# Patient Record
Sex: Male | Born: 1935 | Race: White | Hispanic: No | Marital: Married | Smoking: Never smoker
Health system: Southern US, Community
[De-identification: ages and names within clinical notes are randomized; demographics above are authoritative.]

## PROBLEM LIST (undated history)

## (undated) DIAGNOSIS — R413 Other amnesia: Secondary | ICD-10-CM

## (undated) DIAGNOSIS — E7800 Pure hypercholesterolemia, unspecified: Secondary | ICD-10-CM

## (undated) DIAGNOSIS — N200 Calculus of kidney: Secondary | ICD-10-CM

## (undated) DIAGNOSIS — G4730 Sleep apnea, unspecified: Secondary | ICD-10-CM

## (undated) HISTORY — DX: Calculus of kidney: N20.0

## (undated) HISTORY — DX: Sleep apnea, unspecified: G47.30

## (undated) HISTORY — DX: Other amnesia: R41.3

## (undated) HISTORY — DX: Pure hypercholesterolemia, unspecified: E78.00

---
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
## 1959-06-02 HISTORY — PX: WISDOM TOOTH EXTRACTION: SHX21

## 2002-04-20 ENCOUNTER — Encounter (INDEPENDENT_AMBULATORY_CARE_PROVIDER_SITE_OTHER): Payer: Self-pay | Admitting: *Deleted

## 2002-04-20 ENCOUNTER — Ambulatory Visit (HOSPITAL_BASED_OUTPATIENT_CLINIC_OR_DEPARTMENT_OTHER): Admission: RE | Admit: 2002-04-20 | Discharge: 2002-04-20 | Payer: Self-pay | Admitting: Urology

## 2003-03-01 ENCOUNTER — Ambulatory Visit (HOSPITAL_COMMUNITY): Admission: RE | Admit: 2003-03-01 | Discharge: 2003-03-01 | Payer: Self-pay | Admitting: Gastroenterology

## 2003-03-01 ENCOUNTER — Encounter (INDEPENDENT_AMBULATORY_CARE_PROVIDER_SITE_OTHER): Payer: Self-pay | Admitting: Specialist

## 2007-08-03 ENCOUNTER — Encounter: Payer: Self-pay | Admitting: Internal Medicine

## 2007-08-03 ENCOUNTER — Ambulatory Visit (HOSPITAL_BASED_OUTPATIENT_CLINIC_OR_DEPARTMENT_OTHER): Admission: RE | Admit: 2007-08-03 | Discharge: 2007-08-03 | Payer: Self-pay | Admitting: Otolaryngology

## 2007-08-14 ENCOUNTER — Ambulatory Visit: Payer: Self-pay | Admitting: Internal Medicine

## 2007-11-21 ENCOUNTER — Ambulatory Visit: Payer: Self-pay | Admitting: Internal Medicine

## 2007-11-21 DIAGNOSIS — G4733 Obstructive sleep apnea (adult) (pediatric): Secondary | ICD-10-CM

## 2007-11-21 DIAGNOSIS — E7800 Pure hypercholesterolemia, unspecified: Secondary | ICD-10-CM

## 2007-11-21 DIAGNOSIS — J301 Allergic rhinitis due to pollen: Secondary | ICD-10-CM | POA: Insufficient documentation

## 2007-12-23 ENCOUNTER — Encounter: Payer: Self-pay | Admitting: Internal Medicine

## 2007-12-26 ENCOUNTER — Ambulatory Visit: Payer: Self-pay | Admitting: Internal Medicine

## 2008-02-29 ENCOUNTER — Encounter: Payer: Self-pay | Admitting: Internal Medicine

## 2008-04-24 ENCOUNTER — Ambulatory Visit: Payer: Self-pay | Admitting: Internal Medicine

## 2008-04-24 DIAGNOSIS — G2581 Restless legs syndrome: Secondary | ICD-10-CM

## 2009-11-01 ENCOUNTER — Ambulatory Visit: Payer: Self-pay | Admitting: Internal Medicine

## 2010-07-01 NOTE — Assessment & Plan Note (Signed)
Summary: rov/apc   Copy to:  [facility] Primary Provider/Referring Provider:  Tisovec  CC:  Follow up visit-sleep; last seen 11/09.Marland Kitchen  History of Present Illness: HYPERCHOLESTEROLEMIA (ICD-272.0) ALLERGY (ICD-995.3) SLEEP APNEA (ICD-780.57)  12/26/07- 75 yo returning for f/u of moderate sleep apnea. We discussed status, driving safety, medical issues and available treatments. Reviewed sleep hygiene. HS 11-1130PM, up 7AM. Sleeps through night except nocturia 1-2. CPAP has not reduced nocturia frequency.Likes his current cpap mask better. Takes amitrotylline at bedtime for Restless Legs and for ejaculatory pain. Autotitration cpap gave AHI 2.7 on 02/29/08.  2009-11-26- OSA Was noting daytime sleepiness so he has been going to bed earlier and getting about 9 hours of sleep which has helped. Pressure is set at 7 by our records- He thought it had been raised. Denies daytime sleepiness. Little caffeine or naps. No snore-through.  Good compliance and control by his description. Continues amitriptyline at bedtime. Med review. Occasional allery/ nasal congestion interferes with sleep. We discussed cautious use of Afrin.     Preventive Screening-Counseling & Management  Alcohol-Tobacco     Smoking Status: never  Current Medications (verified): 1)  Adult Aspirin Ec Low Strength 81 Mg  Tbec (Aspirin) .Marland Kitchen.. 1 By Mouth Daily 2)  Simvastatin 80 Mg  Tabs (Simvastatin) .Marland Kitchen.. 1 By Mouth Daily 3)  Amitriptyline Hcl 25 Mg  Tabs (Amitriptyline Hcl) .... 1/2 By Mouth Daily 4)  Elmiron 100 Mg  Caps (Pentosan Polysulfate Sodium) .Marland Kitchen.. 1 By Mouth Daily To Two Times A Day 5)  Multivitamins  Tabs (Multiple Vitamin) .... Take 1 By Mouth Once Daily 6)  Calcium 500/vitamin D 500-125 Mg-Unit Tabs (Calcium Carbonate-Vitamin D) .... Take 1 By Mouth Once Daily 7)  Cpap 7 [facility]  Allergies (verified): No Known Drug Allergies  Past History:  Past Medical History: Last updated: 11/21/2007 Sleep Apnea- NPSG  08/03/07 AHI 10/hr, RDI 19.7/hr  Family History: Last updated: 2009/11/26 Father - died heart disease Mother- died cancer, pulmonary embolism  Social History: Last updated: Nov 26, 2009 Patient never smoked.  Real Estate- rental properties  Risk Factors: Smoking Status: never (2009/11/26)  Past Surgical History: Tonsillectomy at age 25. Repair fx right clavicle  Family History: Father - died heart disease Mother- died cancer, pulmonary embolism  Social History: Patient never smoked.  Real Estate- rental properties  Review of Systems      See HPI       The patient complains of nasal congestion/difficulty breathing through nose.  The patient denies shortness of breath with activity, shortness of breath at rest, productive cough, non-productive cough, coughing up blood, chest pain, irregular heartbeats, acid heartburn, indigestion, loss of appetite, weight change, abdominal pain, difficulty swallowing, sore throat, tooth/dental problems, headaches, and sneezing.    Vital Signs:  Patient profile:   75 year old male Height:      70 inches Weight:      198 pounds BMI:     28.51 O2 Sat:      99 % on Room air Pulse rate:   91 / minute BP sitting:   126 / 78  (right arm) Cuff size:   regular  Vitals Entered By: Reynaldo Minium CMA 26-Nov-2009 9:04 AM)  O2 Flow:  Room air CC: Follow up visit-sleep; last seen 11/09.   Physical Exam  Additional Exam:  General: A/Ox3; pleasant and cooperative, NAD, SKIN: no rash, lesions NODES: no lymphadenopathy HEENT: [place]/AT, EOM- WNL, Conjuctivae- clear, PERRLA, TM-WNL, Nose- clear, Throat- clear and wnl, Mallampati  II NECK: Supple w/ fair ROM, JVD- none, normal carotid impulses w/o bruits Thyroid- normal to palpation CHEST: Clear to P&A HEART: RRR, no m/g/r heard ABDOMEN: Soft and nl; QVZ:DGLO, nl pulses, no edema  NEURO: Grossly intact to observation, no tremor      Impression & Recommendations:  Problem # 1:  SLEEP APNEA  (ICD-780.57)  Obstructive sleep apnea with good compliance and control.  Problem # 2:  ALLERGY (ICD-995.3)  occasional rhinitis interfering with sleep. We will have him try as needed use of Afrrin.  Problem # 3:  RESTLESS LEG SYNDROME (ICD-333.94) He is not bothered as much by limb movement now and has stopped Requip.  Other Orders: Est. Patient Level III (75643)  Patient Instructions: 1)  Please schedule a follow-up appointment in 1 year. 2)  Continue present CPAP- call if any problems 3)  For occasional stuffy nose at night, try otc Afrin nasal spray,  but don't use it more than a couple of days in a row.  Prevention & Chronic Care Immunizations   Influenza vaccine: Not documented    Tetanus booster: Not documented    Pneumococcal vaccine: Not documented    H. zoster vaccine: Not documented  Colorectal Screening   Hemoccult: Not documented    Colonoscopy: Not documented  Other Screening   PSA: Not documented   Smoking status: never  (11/01/2009)  Lipids   Total Cholesterol: Not documented   LDL: Not documented   LDL Direct: Not documented   HDL: Not documented   Triglycerides: Not documented    SGOT (AST): Not documented   SGPT (ALT): Not documented   Alkaline phosphatase: Not documented   Total bilirubin: Not documented  Self-Management Support :    Lipid self-management support: Not documented

## 2010-08-12 ENCOUNTER — Telehealth (INDEPENDENT_AMBULATORY_CARE_PROVIDER_SITE_OTHER): Payer: Self-pay | Admitting: *Deleted

## 2010-08-19 NOTE — Progress Notes (Signed)
Summary: returning call  Phone Note Call from Patient Call back at [phone]   Caller: Patient Call For: young Summary of Call: Returning Katie's call. Initial call taken by: Darletta Moll,  August 12, 2010 10:05 AM  Follow-up for Phone Call        pt retruned call from [facility] re: appt. call [phone]. Tivis Ringer, CNA  August 12, 2010 3:01 PM   [facility] at given number-need to let him know that CDY will not be in the office until late June 1st and we need to [facility] to later time or date.Reynaldo Minium CMA  August 12, 2010 4:08 PM    I spoke with patient yesterday and [facility] to to 145pm on 10-31-2010.Reynaldo Minium CMA  August 14, 2010 11:18 AM

## 2010-10-14 NOTE — Procedures (Signed)
NAME:  Dylan [name], Dylan [name] NO.:  [id number]   MEDICAL RECORD NO.:  [id number]          PATIENT TYPE:  OUT   LOCATION:  SLEEP CENTER                 FACILITY:  [facility]   PHYSICIAN:  Clinton D. Maple Hudson, MD, FCCP, FACPDATE OF BIRTH:  02-20-38   DATE OF STUDY:  08/03/2007                            NOCTURNAL POLYSOMNOGRAM   REFERRING PHYSICIAN:  Hermelinda Medicus, M.D.   INDICATION FOR STUDY:  Hypersomnia with sleep apnea.   EPWORTH SLEEPINESS SCORE:  3/24, BMI 27.3, weight 190 pounds, height 70  inches, neck 16.5 inches.   MEDICATIONS:  Home medication charted and reviewed.   SLEEP ARCHITECTURE:  Total sleep time 384 minutes with sleep efficiency  93.2%.  Stage I was 17.2%, stage II 59.8%, stage III 14.7%, REM 8.3% of  total sleep time.  Sleep latency 5.5 minutes, REM latency 145.5 minutes,  awake after sleep onset 22.5 minutes, arousal index 20.3.  Bedtime  medication included finasteride, Elmiron, simvastatin, aspirin,  cefuroxime and amitriptyline taken at 9 p.m.   RESPIRATORY DATA:  NPSG protocol.  Apnea-hypopnea index (AHI) 10 per  hour, indicating mild obstructive sleep apnea/hypopnea syndrome.  There  were 64 scored events including 35 obstructive apneas, 4 central apneas,  3 mixed apneas and 22 hypopneas.  REM AHI was 3.8.  Events were most  common while supine.  Respiratory disturbance index (RDI) 19.7, biasing  interpretation towards mild to moderate range rather than mild.   OXYGEN DATA:  Loud snoring with oxygen desaturation to a nadir of 79%.  Mean oxygen saturation through the study was 93.4% on room air.   CARDIAC DATA:  Sinus rhythm with occasional PVC.   MOVEMENT-PARASOMNIA:  No significant movement disturbance.  Bathroom x1.   IMPRESSIONS-RECOMMENDATIONS:  1. Mild to moderate obstructive sleep apnea/hypopnea syndrome, apnea-      hypopnea index 10 per hour, respiratory disturbance index 19.7 per      hour with events more common while supine.  Loud  snoring with      oxygen desaturation to a nadir of 79%.  2. Scores in this range can be appropriately addressed with continuous      positive airway pressure.  Consider return for continuous positive      airway pressure titration or evaluate for alternative therapies as      appropriate.      Clinton D. Maple Hudson, MD, [facility], FACP  Diplomate, Biomedical engineer of Sleep Medicine  Electronically Signed     CDY/MEDQ  D:  08/14/2007 09:37:48  T:  08/14/2007 13:04:01  Job:  [id number]

## 2010-10-17 NOTE — Op Note (Signed)
NAME:  Dylan [name], Dylan [name]                          ACCOUNT NO.:  [id number]   MEDICAL RECORD NO.:  [id number]                   PATIENT TYPE:  AMB   LOCATION:  ENDO                                 FACILITY:  [facility]   PHYSICIAN:  Petra Kuba, M.D.                 DATE OF BIRTH:  1935-06-05   DATE OF PROCEDURE:  03/01/2003  DATE OF DISCHARGE:                                 OPERATIVE REPORT   PROCEDURE:  Colonoscopy with biopsy.   INDICATION:  Screening.  Consent was signed after risks, benefits, methods,  options thoroughly discussed multiple times in the past.   MEDICINES USED:  1. Demerol 80.  2. Versed 8.   DESCRIPTION OF PROCEDURE:  Rectal inspection was pertinent for external  hemorrhoids, small.  Digital exam was negative.  Video colonoscope was  inserted, fairly easily advanced around the colon to the cecum.  This did  require rolling him on his back and some abdominal pressure.  On insertion,  some left-sided diverticula were seen as well as some left-sided erosions.  They looked probably aspirin-induced.  No other abnormality was seen on  insertion.  The cecum was identified by the appendiceal orifice and the  ileocecal valve.  In fact, the scope was inserted a short ways in the  terminal ileum which was normal.  Photodocumentation was obtained.  The  scope was slowly withdrawn.  The prep was adequate.  There was some liquid  stool that required washing and suctioning.  On slow withdrawal through the  colon, the cecum, ascending, and transverse were normal.  The scope was  withdrawn around the left side.  The erosions were seen, and a few biopsies  were obtained.  There was also the left-sided mild to moderate diverticula  but no polyps, tumors, or masses.  Anorectal pull-through and retroflexion  confirmed some small hemorrhoids.  The scope was straightened, readvanced a  short ways up the left side of the colon; air was suctioned, scope removed.  The patient tolerated  the procedure well.  There was no obvious immediate  complication.   ENDOSCOPIC DIAGNOSES:  1. Internal-external small hemorrhoids.  2. Left-sided diverticula.  3. Left-sided erosions, probably aspirin-induced, status post biopsy.  4. Otherwise, within normal limits to the terminal ileum.   PLAN:  1. Await pathology.  2. Happy to see back p.r.n.  3.     Otherwise, return care to Gaspar Garbe, M.D. for the customary health      care maintenance to include yearly rectals and guaiacs.  4. Repeat screening in 5-10 years.                                               Petra Kuba, M.D.    MEM/MEDQ  D:  03/01/2003  T:  03/01/2003  Job:  [id number]   cc:   Gaspar Garbe, M.D.  [address]  [place]  [place] [address]  Fax: [phone]

## 2010-10-17 NOTE — Op Note (Signed)
   NAME:  JJ, ENYEART                        ACCOUNT NO.:  [id number]   MEDICAL RECORD NO.:  [id number]                   PATIENT TYPE:  AMB   LOCATION:  [facility]                                 FACILITY:  [facility]   PHYSICIAN:  Sigmund I. Patsi Sears, M.D.         DATE OF BIRTH:  1936/02/14   DATE OF PROCEDURE:  DATE OF DISCHARGE:                                 OPERATIVE REPORT   PREOPERATIVE DIAGNOSIS:  Gross hematuria, chronic prostatitis.   POSTOPERATIVE DIAGNOSIS:  Gross hematuria, chronic prostatitis.   OPERATION:  Cystourethroscopy, cold cup prostate biopsy, cauterization of  the prostate and bladder, transrectal needle biopsy of the prostate,  prostate ultrasound and interpretation.   DESCRIPTION OF PROCEDURE:  After appropriate preanesthesia the patient was  brought  to the operating room and placed on the operating table in the  dorsal supine position where general LMA anesthesia was introduced. He was  then re-placed in the dorsal lithotomy position where the pubis was prepped  with Betadine solution and draped in the usual fashion.   The cystoscopy revealed a nodular prostate with inflammatory polypoid  changes, specifically in the distal right lateral portion of the prostate.  These mucosal abnormalities were cold cup biopsied and the bladder was cold  cup biopsied as well, although there was no evidence of bladder stone, tumor  or diverticular formation. All biopsy sites were cauterized, as there was  very friable tissue and easy bleeding.   Following this, a transrectal ultrasound was accomplished which showed a 58  cc prostate, with multiple areas of mixed echogenicity. Twelve biopsies were  taken from the prostate from the lateral  portions, central  portions and  apex. These were sent to the laboratory for evaluation. There was mild  transrectal bleeding which stopped by the end of the procedure.   A red Robinson catheter was placed in the bladder and the bladder  was  irrigated and the catheter was removed. The patient was then awakened and  taken to the recovery room in good condition. A B&O suppository was placed  at the beginning of the case.                                               Sigmund I. Patsi Sears, M.D.    SIT/MEDQ  D:  04/20/2002  T:  04/20/2002  Job:  [id number]

## 2010-10-31 ENCOUNTER — Ambulatory Visit (INDEPENDENT_AMBULATORY_CARE_PROVIDER_SITE_OTHER): Payer: Medicare Other | Admitting: Internal Medicine

## 2010-10-31 ENCOUNTER — Ambulatory Visit: Payer: Self-pay | Admitting: Internal Medicine

## 2010-10-31 ENCOUNTER — Encounter: Payer: Self-pay | Admitting: Internal Medicine

## 2010-10-31 VITALS — BP 110/72 | HR 84 | Ht 70.0 in | Wt 201.8 lb

## 2010-10-31 DIAGNOSIS — G4730 Sleep apnea, unspecified: Secondary | ICD-10-CM

## 2010-10-31 DIAGNOSIS — T7840XA Allergy, unspecified, initial encounter: Secondary | ICD-10-CM

## 2010-10-31 NOTE — Progress Notes (Signed)
  Subjective:    Patient ID: Dylan [name], male    DOB: January 16, 1936, 75 y.o.   MRN: [id number]  HPI 10/31/10- 75 yoM followed for OSA, Restless Legs, hx allergy. Last here November 01, 2009 - note reviewed. Continues CPAP 11/ [facility]. Doing well. Discussed replacement supplies and mask in last year. Denies daytime sleepiness. Bedtime is now 10PM Little allergy trouble this year.   Review of Systems Constitutional:   No weight loss, night sweats,  Fevers, chills, fatigue, lassitude. HEENT:   No headaches,  Difficulty swallowing,  Tooth/dental problems,  Sore throat,                No sneezing, itching, ear ache, nasal congestion, post nasal drip,   CV:  No chest pain,  Orthopnea, PND, swelling in lower extremities, anasarca, dizziness, palpitations  GI  No heartburn, indigestion, abdominal pain, nausea, vomiting, diarrhea, change in bowel habits, loss of appetite  Resp: No shortness of breath with exertion or at rest.  No excess mucus, no productive cough,  No non-productive cough,  No coughing up of blood.  No change in color of mucus.  No wheezing.   Skin: no rash or lesions.  GU: no dysuria, change in color of urine, no urgency or frequency.  No flank pain.  MS:  No joint pain or swelling.  No decreased range of motion.  No back pain.  Psych:  No change in mood or affect. No depression or anxiety.  No memory loss.      Objective:   Physical Exam General- Alert, Oriented, Affect-appropriate, Distress- none acute  Skin- rash-none, lesions- none, excoriation- none  Lymphadenopathy- none  Head- atraumatic  Eyes- Gross vision intact, PERRLA, conjunctivae clear secretions  Ears- Hearing, canals, Tm - normal  Nose- Clear, No-Septal dev, mucus, polyps, erosion, perforation   Throat- Mallampati II , mucosa clear , drainage- none, tonsils- atrophic  Neck- flexible , trachea midline, no stridor , thyroid nl, carotid no bruit  Chest - symmetrical excursion , unlabored   Heart/CV- RRR , no murmur , no gallop  , no rub, nl s1 s2                     - JVD- none , edema- none, stasis changes- none, varices- none     Lung- clear to P&A, wheeze- none, cough- none , dullness-none, rub- none     Chest wall-   Abd- tender-no, distended-no, bowel sounds-present, HSM- no  Br/ Gen/ Rectal- Not done, not indicated  Extrem- cyanosis- none, clubbing, none, atrophy- none, strength- nl  Neuro- grossly intact to observation         Assessment & Plan:

## 2010-10-31 NOTE — Assessment & Plan Note (Addendum)
Good compliance and control with CPAP to continue at 11. Comfort measures discussed.

## 2010-10-31 NOTE — Patient Instructions (Signed)
Continue CPAP- please call if you have any problems.

## 2010-10-31 NOTE — Assessment & Plan Note (Addendum)
Good control this year, although several in his family had troubles. He takes that to indicate he is doing the right things.

## 2011-11-03 ENCOUNTER — Ambulatory Visit (INDEPENDENT_AMBULATORY_CARE_PROVIDER_SITE_OTHER): Payer: Medicare Other | Admitting: Internal Medicine

## 2011-11-03 ENCOUNTER — Encounter: Payer: Self-pay | Admitting: Internal Medicine

## 2011-11-03 VITALS — BP 122/84 | HR 77 | Ht 70.0 in | Wt 199.2 lb

## 2011-11-03 DIAGNOSIS — G4733 Obstructive sleep apnea (adult) (pediatric): Secondary | ICD-10-CM

## 2011-11-03 DIAGNOSIS — J301 Allergic rhinitis due to pollen: Secondary | ICD-10-CM

## 2011-11-03 NOTE — Patient Instructions (Signed)
Take your whole CPAP rig to [facility] so they can see what you mean about airflow when it is running and you are wearing it. They may suggest a different mask to try.  If adjusting the mask is insufficient, then we can arrange a home pressure compliance download study to see where the pressure should be,

## 2011-11-03 NOTE — Progress Notes (Signed)
  Subjective:    Patient ID: Dylan [name], male    DOB: 06-09-1935, 76 y.o.   MRN: [id number]  HPI 10/31/10- 75 yoM followed for OSA, Restless Legs, hx allergy. Last here November 01, 2009 - note reviewed. Continues CPAP 11/ [facility]. Doing well. Discussed replacement supplies and mask in last year. Denies daytime sleepiness. Bedtime is now 10PM Little allergy trouble this year.   11/03/11- 75 yoM followed for OSA, Restless Legs, hx allergy. Wears CPAP every night;"alot of air" coming through mask as well He feels control is good at 11/[facility]. Occasionally snores through the mask. He got his masked from [facility]. Allergy-Little pollen related rhinitis this spring.  ROS-see HPI Constitutional:   No-   weight loss, night sweats, fevers, chills, fatigue, lassitude. HEENT:   No-  headaches, difficulty swallowing, tooth/dental problems, sore throat,       No-  sneezing, itching, ear ache, nasal congestion, post nasal drip,  CV:  No-   chest pain, orthopnea, PND, swelling in lower extremities, anasarca, dizziness, palpitations Resp: No-   shortness of breath with exertion or at rest.              No-   productive cough,  No non-productive cough,  No- coughing up of blood.              No-   change in color of mucus.  No- wheezing.   Skin: No-   rash or lesions. GI:  No-   heartburn, indigestion, abdominal pain, nausea, vomiting,  GU:  MS:  No-   joint pain or swelling.  Neuro-     nothing unusual Psych:  No- change in mood or affect. No depression or anxiety.  No memory loss.  OBJ- Physical Exam General- Alert, Oriented, Affect-appropriate, Distress- none acute, overweight Skin- rash-none, lesions- none, excoriation- none Lymphadenopathy- none Head- atraumatic            Eyes- Gross vision intact, PERRLA, conjunctivae and secretions clear            Ears- Hearing, canals-normal            Nose- Clear, no-Septal dev, mucus, polyps, erosion, perforation             Throat- Mallampati II-III ,  mucosa clear , drainage- none, tonsils- atrophic Neck- flexible , trachea midline, no stridor , thyroid nl, carotid no bruit Chest - symmetrical excursion , unlabored           Heart/CV- RRR , no murmur , no gallop  , no rub, nl s1 s2                           - JVD- none , edema- none, stasis changes- none, varices- none           Lung- clear to P&A, wheeze- none, cough- none , dullness-none, rub- none           Chest wall-  Abd-  Br/ Gen/ Rectal- Not done, not indicated Extrem- cyanosis- none, clubbing, none, atrophy- none, strength- nl Neuro- grossly intact to observation

## 2011-11-08 NOTE — Assessment & Plan Note (Signed)
Well controlled at this spring

## 2011-11-08 NOTE — Assessment & Plan Note (Addendum)
Good compliance and control with CPAP 11/[facility]. He may need mask adjustment for leaks and is advised to take his mask to [facility] for evaluation.

## 2011-12-21 ENCOUNTER — Telehealth: Payer: Self-pay | Admitting: Internal Medicine

## 2011-12-21 DIAGNOSIS — G4733 Obstructive sleep apnea (adult) (pediatric): Secondary | ICD-10-CM

## 2011-12-21 NOTE — Telephone Encounter (Signed)
I spoke with pt and he states his cpap is currently set at 7. He states he does not feel like he is getting a good nights rest. He feels like his pressure needs to be readjusted. Please advise Dr.Young thanks

## 2011-12-23 NOTE — Telephone Encounter (Signed)
Order- [facility]- increase CPAP from 7 to 8 cwp   Dx OSA

## 2011-12-23 NOTE — Telephone Encounter (Signed)
LMOMTCB x 1 for pt. Order sent to [facility].

## 2011-12-24 NOTE — Telephone Encounter (Signed)
Spoke with pt and notified that the order was sent to [facility]. Pt verbalized understanding and states nothing further needed.

## 2012-06-22 ENCOUNTER — Telehealth: Payer: Self-pay | Admitting: Internal Medicine

## 2012-06-22 DIAGNOSIS — G4733 Obstructive sleep apnea (adult) (pediatric): Secondary | ICD-10-CM

## 2012-06-22 NOTE — Telephone Encounter (Signed)
Pt stated his machine is worn out and does not want to get it repaired since he has had it for over 5 years. He wants order sent to [facility] for new machine. i have done so. Nothing further was needed

## 2012-06-23 ENCOUNTER — Telehealth: Payer: Self-pay | Admitting: Internal Medicine

## 2012-06-23 NOTE — Telephone Encounter (Signed)
Per CY-let patient know that they can discuss all these concerns at his appt-not far off. Pt aware and will speak with CY on 06-27-12.

## 2012-06-23 NOTE — Telephone Encounter (Signed)
Called [facility], spoke with Fayrene Fearing who reported that because it is over 5 years since he first received it, he will need a face-to-face w/ documentation that the CPAP is needed and pt is compliant.  OV scheduled w/ CY 1.27.14 @ 1:30pm.  In the meantime, pt is seeing Dr Wylene Simmer for the "problem with his esophogus" > rx'd with "something to help with the acid" and "esophogeal spasms."  Pt does not know the name and dosages of these meds.  Pt requesting if CY has any additional recs for him as he believes this issue if from his CPAP to help him until his upcoming ov.  Dr Maple Hudson please advise, thanks.

## 2012-06-27 ENCOUNTER — Ambulatory Visit (INDEPENDENT_AMBULATORY_CARE_PROVIDER_SITE_OTHER): Payer: Medicare Other | Admitting: Internal Medicine

## 2012-06-27 ENCOUNTER — Encounter: Payer: Self-pay | Admitting: Internal Medicine

## 2012-06-27 VITALS — BP 122/62 | HR 62 | Ht 70.0 in | Wt 191.2 lb

## 2012-06-27 DIAGNOSIS — K219 Gastro-esophageal reflux disease without esophagitis: Secondary | ICD-10-CM

## 2012-06-27 DIAGNOSIS — G4733 Obstructive sleep apnea (adult) (pediatric): Secondary | ICD-10-CM

## 2012-06-27 NOTE — Patient Instructions (Addendum)
Order- [facility]  Reduce CPAP to 7  Avoid peppermint Continue Protonix Consider taking a liquid antacid like Mylanta if you feel the heart burn   Try otc Biotene to help with sensation of dry mouth

## 2012-06-27 NOTE — Progress Notes (Signed)
Subjective:    Patient ID: Dylan [name], male    DOB: 1935/06/11, 78 y.o.   MRN: [id number]  HPI 10/31/10- 75 yoM followed for OSA, Restless Legs, hx allergy. Last here November 01, 2009 - note reviewed. Continues CPAP 11/ Advanced. Doing well. Discussed replacement supplies and mask in last year. Denies daytime sleepiness. Bedtime is now 10PM Little allergy trouble this year.   11/03/11- 75 yoM followed for OSA, Restless Legs, hx allergy. Wears CPAP every night;"alot of air" coming through mask as well He feels control is good at 11/Advanced. Occasionally snores through the mask. He got his masked from [facility]. Allergy-Little pollen related rhinitis this spring.  06/27/12- 75 yoM followed for OSA, Restless Legs, hx allergy. Follows For:  Pt requesting new CPAP machine - Current machine over 5 yrs old and is drying mouth out and wakes up with burning in esophagus  for past 2 weeks - Averages 8 hrs /night on CPAP ?11/ Advanced Download shows best pressure around 8.5 with excellent compliance and control. He is eating peppermint and having heartburn.   ROS-see HPI Constitutional:   No-   weight loss, night sweats, fevers, chills, fatigue, lassitude. HEENT:   No-  headaches, difficulty swallowing, tooth/dental problems, sore throat,       No-  sneezing, itching, ear ache, nasal congestion, post nasal drip,  CV:  No-   chest pain, orthopnea, PND, swelling in lower extremities, anasarca, dizziness, palpitations Resp: No-   shortness of breath with exertion or at rest.              No-   productive cough,  No non-productive cough,  No- coughing up of blood.              No-   change in color of mucus.  No- wheezing.   Skin: No-   rash or lesions. GI:  +  heartburn, no-indigestion, abdominal pain, nausea, vomiting,  GU:  MS:  No-   joint pain or swelling.  Neuro-     nothing unusual Psych:  No- change in mood or affect. No depression or anxiety.  No memory loss.  OBJ- Physical Exam General-  Alert, Oriented, Affect-appropriate, Distress- none acute, overweight Skin- rash-none, lesions- none, excoriation- none Lymphadenopathy- none Head- atraumatic            Eyes- Gross vision intact, PERRLA, conjunctivae and secretions clear            Ears- Hearing, canals-normal            Nose- Clear, no-Septal dev, mucus, polyps, erosion, perforation             Throat- Mallampati II-III , +mucosa very dry , drainage- none, tonsils- atrophic Neck- flexible , trachea midline, no stridor , thyroid nl, carotid no bruit Chest - symmetrical excursion , unlabored           Heart/CV- RRR , no murmur , no gallop  , no rub, nl s1 s2                           - JVD- none , edema- none, stasis changes- none, varices- none           Lung- clear to P&A, wheeze- none, cough- none , dullness-none, rub- none           Chest wall-  Abd-  Br/ Gen/ Rectal- Not done, not indicated Extrem- cyanosis- none, clubbing, none, atrophy- none, strength-  nl Neuro- grossly intact to observation

## 2012-07-07 DIAGNOSIS — K219 Gastro-esophageal reflux disease without esophagitis: Secondary | ICD-10-CM | POA: Insufficient documentation

## 2012-07-07 NOTE — Assessment & Plan Note (Signed)
Explained that peppermint promotes reflux and heartburn. He will avoid peppermint, continue protonix, prn mylanta.

## 2012-07-07 NOTE — Assessment & Plan Note (Signed)
To deal with dry mouth we are turning CPAP pressure down to 7 and accepting less control. I think the main problem is drying from his other meds.

## 2012-07-22 ENCOUNTER — Encounter: Payer: Self-pay | Admitting: Internal Medicine

## 2012-07-28 ENCOUNTER — Encounter: Payer: Self-pay | Admitting: Internal Medicine

## 2012-07-29 ENCOUNTER — Ambulatory Visit (INDEPENDENT_AMBULATORY_CARE_PROVIDER_SITE_OTHER): Payer: Medicare Other | Admitting: Internal Medicine

## 2012-07-29 ENCOUNTER — Encounter: Payer: Self-pay | Admitting: Internal Medicine

## 2012-07-29 VITALS — BP 130/80 | HR 72 | Ht 70.0 in | Wt 188.8 lb

## 2012-07-29 DIAGNOSIS — G4733 Obstructive sleep apnea (adult) (pediatric): Secondary | ICD-10-CM

## 2012-07-29 DIAGNOSIS — G2581 Restless legs syndrome: Secondary | ICD-10-CM

## 2012-07-29 NOTE — Patient Instructions (Addendum)
We can continue CPAP 7/ Advanced  Ok to adjust the humidifier and use Biotene as needed  You might try an otc nasal saline gel in your nose whenever needed for dry nose. This may reduce nose bleeds by keeping the lining of your nose from drying out too much.

## 2012-07-29 NOTE — Progress Notes (Signed)
Subjective:    Patient ID: Dylan [name], male    DOB: 1935-08-18, 77 y.o.   MRN: [id number]  HPI 10/31/10- 75 yoM followed for OSA, Restless Legs, hx allergy. Last here November 01, 2009 - note reviewed. Continues CPAP 11/ Advanced. Doing well. Discussed replacement supplies and mask in last year. Denies daytime sleepiness. Bedtime is now 10PM Little allergy trouble this year.   11/03/11- 75 yoM followed for OSA, Restless Legs, hx allergy. Wears CPAP every night;"alot of air" coming through mask as well He feels control is good at 11/Advanced. Occasionally snores through the mask. He got his masked from [facility]. Allergy-Little pollen related rhinitis this spring.  06/27/12- 75 yoM followed for OSA, Restless Legs, hx allergy. Follows For:  Pt requesting new CPAP machine - Current machine over 5 yrs old and is drying mouth out and wakes up with burning in esophagus  for past 2 weeks - Averages 8 hrs /night on CPAP ?11/ Advanced Download shows best pressure around 8.5 with excellent compliance and control. He is eating peppermint and having heartburn.  07/29/12- 77 yoM followed for OSA, Restless Legs, hx allergy. FOLLOWS FOR: wears CPAP 7/ Advanced every night for about 9 hours on avg. Pressure working well for patient. He adjusts humidifier. Trazodone calms restless legs- taking 1/4 tab twice daily.  Likes Biotene. We discussed potential drying effect of his medicines.  ROS-see HPI Constitutional:   No-   weight loss, night sweats, fevers, chills, fatigue, lassitude. HEENT:   No-  headaches, difficulty swallowing, tooth/dental problems, sore throat,       No-  sneezing, itching, ear ache, nasal congestion, post nasal drip,  CV:  No-   chest pain, orthopnea, PND, swelling in lower extremities, anasarca, dizziness, palpitations Resp: No-   shortness of breath with exertion or at rest.              No-   productive cough,  No non-productive cough,  No- coughing up of blood.              No-   change  in color of mucus.  No- wheezing.   Skin: No-   rash or lesions. GI:  +  heartburn, no-indigestion, abdominal pain, nausea, vomiting,  GU:  MS:  No-   joint pain or swelling.  Neuro-     nothing unusual Psych:  No- change in mood or affect. No depression or anxiety.  No memory loss.  OBJ- Physical Exam General- Alert, Oriented, Affect-appropriate, Distress- none acute, overweight Skin- rash-none, lesions- none, excoriation- none Lymphadenopathy- none Head- atraumatic            Eyes- Gross vision intact, PERRLA, conjunctivae and secretions clear            Ears- Hearing, canals-normal            Nose- Clear, no-Septal dev, mucus, polyps, erosion, perforation             Throat- Mallampati II-III , +mucosa  dry , drainage- none, tonsils- atrophic Neck- flexible , trachea midline, no stridor , thyroid nl, carotid no bruit Chest - symmetrical excursion , unlabored           Heart/CV- RRR , no murmur , no gallop  , no rub, nl s1 s2                           - JVD- none , edema- none, stasis changes- none, varices- none  Lung- clear to P&A, wheeze- none, cough- none , dullness-none, rub- none           Chest wall-  Abd-  Br/ Gen/ Rectal- Not done, not indicated Extrem- cyanosis- none, clubbing, none, atrophy- none, strength- nl Neuro- grossly intact to observation

## 2012-07-30 NOTE — Assessment & Plan Note (Signed)
Good compliance and control with CPAP 7. His biggest problem is dry mouth-has to keep adjusting his humidifier. Biotene helps. Plan-add saline gel

## 2012-07-30 NOTE — Assessment & Plan Note (Signed)
He reports that trazodone gets adequate control

## 2012-09-05 ENCOUNTER — Encounter: Payer: Self-pay | Admitting: Internal Medicine

## 2012-10-06 ENCOUNTER — Other Ambulatory Visit: Payer: Self-pay | Admitting: Gastroenterology

## 2012-10-06 DIAGNOSIS — R109 Unspecified abdominal pain: Secondary | ICD-10-CM

## 2012-10-13 ENCOUNTER — Ambulatory Visit
Admission: RE | Admit: 2012-10-13 | Discharge: 2012-10-13 | Disposition: A | Discharge status: Home / Self Care | Payer: Medicare Other | Source: Ambulatory Visit | Attending: Gastroenterology | Admitting: Gastroenterology

## 2012-10-13 DIAGNOSIS — R109 Unspecified abdominal pain: Secondary | ICD-10-CM

## 2012-10-21 ENCOUNTER — Telehealth: Payer: Self-pay | Admitting: Internal Medicine

## 2012-10-21 NOTE — Telephone Encounter (Signed)
Called ands spoke to [email or web address] leasion and she will contact [email or web address] and get infor to them Tobe Sos

## 2012-11-01 ENCOUNTER — Encounter: Payer: Self-pay | Admitting: Internal Medicine

## 2012-11-01 ENCOUNTER — Ambulatory Visit (INDEPENDENT_AMBULATORY_CARE_PROVIDER_SITE_OTHER): Payer: Medicare Other | Admitting: Internal Medicine

## 2012-11-01 VITALS — BP 110/68 | HR 79 | Ht 69.0 in | Wt 196.0 lb

## 2012-11-01 DIAGNOSIS — J301 Allergic rhinitis due to pollen: Secondary | ICD-10-CM

## 2012-11-01 DIAGNOSIS — G4733 Obstructive sleep apnea (adult) (pediatric): Secondary | ICD-10-CM

## 2012-11-01 NOTE — Progress Notes (Signed)
Subjective:    Patient ID: Dylan [name], male    DOB: 1935-11-08, 77 y.o.   MRN: [id number]  HPI 10/31/10- 77 yoM followed for OSA, Restless Legs, hx allergy. Last here November 01, 2009 - note reviewed. Continues CPAP 11/ Advanced. Doing well. Discussed replacement supplies and mask in last year. Denies daytime sleepiness. Bedtime is now 10PM Little allergy trouble this year.   11/03/11- 77 yoM followed for OSA, Restless Legs, hx allergy. Wears CPAP every night;"alot of air" coming through mask as well He feels control is good at 11/Advanced. Occasionally snores through the mask. He got his masked from [facility]. Allergy-Little pollen related rhinitis this spring.  06/27/12- 77 yoM followed for OSA, Restless Legs, hx allergy. Follows For:  Pt requesting new CPAP machine - Current machine over 5 yrs old and is drying mouth out and wakes up with burning in esophagus  for past 2 weeks - Averages 8 hrs /night on CPAP ?11/ Advanced Download shows best pressure around 8.5 with excellent compliance and control. He is eating peppermint and having heartburn.  07/29/12- 77 yoM followed for OSA, Restless Legs, hx allergy. FOLLOWS FOR: wears CPAP 7/ Advanced every night for about 9 hours on avg. Pressure working well for patient. He adjusts humidifier. Trazodone calms restless legs- taking 1/4 tab twice daily.  Likes Biotene. We discussed potential drying effect of his medicines.  11/01/12- 77 yoM followed for OSA, Restless Legs, hx allergy. FOLLOWS GEX:BMWUX CPAP 7/ Advanced every night for about 8 hours each night. He likes his new CPAP machine. Control and compliance are good. Nasal pillows mask. No seasonal rhinitis problems the spring. We discussed of CPAP humidifier. Trazodone helps his restless legs.  ROS-see HPI Constitutional:   No-   weight loss, night sweats, fevers, chills, fatigue, lassitude. HEENT:   No-  headaches, difficulty swallowing, tooth/dental problems, sore throat,       No-   sneezing, itching, ear ache, nasal congestion, post nasal drip,  CV:  No-   chest pain, orthopnea, PND, swelling in lower extremities, anasarca, dizziness, palpitations Resp: No-   shortness of breath with exertion or at rest.              No-   productive cough,  No non-productive cough,  No- coughing up of blood.              No-   change in color of mucus.  No- wheezing.   Skin: No-   rash or lesions. GI:  +  heartburn, no-indigestion, abdominal pain, nausea, vomiting,  GU:  MS:  No-   joint pain or swelling.  Neuro-     nothing unusual Psych:  No- change in mood or affect. No depression or anxiety.  No memory loss.  OBJ- Physical Exam General- Alert, Oriented, Affect-appropriate, Distress- none acute, overweight, medium build Skin- rash-none, lesions- none, excoriation- none Lymphadenopathy- none Head- atraumatic            Eyes- Gross vision intact, PERRLA, conjunctivae and secretions clear            Ears- Hearing, canals-normal            Nose- Clear, no-Septal dev, mucus, polyps, erosion, perforation             Throat- Mallampati II-III , +mucosa  dry , drainage- none, tonsils- atrophic Neck- flexible , trachea midline, no stridor , thyroid nl, carotid no bruit Chest - symmetrical excursion , unlabored  Heart/CV- RRR , no murmur , no gallop  , no rub, nl s1 s2                           - JVD- none , edema- none, stasis changes- none, varices- none           Lung- clear to P&A, wheeze- none, cough- none , dullness-none, rub- none           Chest wall-  Abd-  Br/ Gen/ Rectal- Not done, not indicated Extrem- cyanosis- none, clubbing, none, atrophy- none, strength- nl Neuro- grossly intact to observation

## 2012-11-01 NOTE — Patient Instructions (Addendum)
We can continue CPAP 7/ Advanced  Consider an otc saline nasal spray for dryness

## 2012-11-12 NOTE — Assessment & Plan Note (Signed)
Good compliance and control, pressure is appropriate.

## 2012-11-12 NOTE — Assessment & Plan Note (Signed)
Symptoms have been much milder in recent years. Plan-saline nasal spray if needed

## 2013-01-26 ENCOUNTER — Ambulatory Visit: Payer: Medicare Other | Admitting: Internal Medicine

## 2013-05-31 ENCOUNTER — Telehealth: Payer: Self-pay | Admitting: Internal Medicine

## 2013-05-31 NOTE — Telephone Encounter (Signed)
lmomtcb x1 

## 2013-05-31 NOTE — Telephone Encounter (Signed)
Pt states that with cold weather he has been having some pains in chest and esophagus when taking a deep breath. Pt states that this comes and goes. Pt reports that he has been having some indigestion and gas. Per Dr Doneta Public, pt needed to contact Pulm Dr.  No Known Allergies  Dr young please advise. Thanks.                                                  Current Medication List   aspirin 81 MG tablet  Take 81 mg by mouth daily.     pentosan polysulfate 100 MG capsule  Commonly known as:  ELMIRON  Take 100 mg by mouth. One to two times daily     sertraline 50 MG tablet  Commonly known as:  ZOLOFT  Take 25 mg by mouth Daily.     simvastatin 80 MG tablet  Commonly known as:  ZOCOR  Take 80 mg by mouth at bedtime.

## 2013-05-31 NOTE — Telephone Encounter (Signed)
[email or web address] on home and cell number. Nelton Amsden, CMA  

## 2013-05-31 NOTE — Telephone Encounter (Signed)
Per CY-have patient use Mucinex DM and extra fluids. Thanks.

## 2013-06-02 ENCOUNTER — Telehealth: Payer: Self-pay | Admitting: Internal Medicine

## 2013-06-02 NOTE — Telephone Encounter (Signed)
lmomtcb x1 

## 2013-06-02 NOTE — Telephone Encounter (Signed)
[phone] Returning a call

## 2013-06-02 NOTE — Telephone Encounter (Signed)
Called and spoke with pt and he is aware of CY recs from phone note on 05/31/2013.  Pt is aware to take the mucinex dm and increase fluids.  Pt is aware and nothing further is needed.

## 2013-11-02 ENCOUNTER — Encounter: Payer: Self-pay | Admitting: Internal Medicine

## 2013-11-02 ENCOUNTER — Ambulatory Visit (INDEPENDENT_AMBULATORY_CARE_PROVIDER_SITE_OTHER): Payer: Commercial Managed Care - HMO | Admitting: Internal Medicine

## 2013-11-02 VITALS — BP 118/64 | HR 73 | Ht 70.0 in | Wt 199.6 lb

## 2013-11-02 DIAGNOSIS — G4733 Obstructive sleep apnea (adult) (pediatric): Secondary | ICD-10-CM

## 2013-11-02 DIAGNOSIS — J301 Allergic rhinitis due to pollen: Secondary | ICD-10-CM

## 2013-11-02 NOTE — Assessment & Plan Note (Signed)
Compliance and control. I don't think he is excessively sleepy. We discussed alternatives to CPAP that he was interested in learning more about oral appliances. Plan-allow for referral to discuss oral appliances

## 2013-11-02 NOTE — Assessment & Plan Note (Signed)
Okay to continue symptomatic management

## 2013-11-02 NOTE — Progress Notes (Signed)
Subjective:    Patient ID: Dylan [name], male    DOB: [birth year], 78 y.o.   MRN: [id number]  HPI 10/31/10- 75 yoM followed for OSA, Restless Legs, hx allergy. Last here November 01, 2009 - note reviewed. Continues CPAP 11/ Advanced. Doing well. Discussed replacement supplies and mask in last year. Denies daytime sleepiness. Bedtime is now 10PM Little allergy trouble this year.   11/03/11- 89 yoM followed for OSA, Restless Legs, hx allergy. Wears CPAP every night;"alot of air" coming through mask as well He feels control is good at 11/Advanced. Occasionally snores through the mask. He got his masked from [place]. Allergy-Little pollen related rhinitis this spring.  06/27/12- 39 yoM followed for OSA, Restless Legs, hx allergy. Follows For:  Pt requesting new CPAP machine - Current machine over 5 yrs old and is drying mouth out and wakes up with burning in esophagus  for past 2 weeks - Averages 8 hrs /night on CPAP ?11/ Advanced Download shows best pressure around 8.5 with excellent compliance and control. He is eating peppermint and having heartburn.  07/29/12- 74 yoM followed for OSA, Restless Legs, hx allergy. FOLLOWS FOR: wears CPAP 7/ Advanced every night for about 9 hours on avg. Pressure working well for patient. He adjusts humidifier. Trazodone calms restless legs- taking 1/4 tab twice daily.  Likes Biotene. We discussed potential drying effect of his medicines.  11/01/12- 51 yoM followed for OSA, Restless Legs, hx allergy. FOLLOWS PPJ:KDTOI CPAP 7/ Advanced every night for about 8 hours each night. He likes his new CPAP machine. Control and compliance are good. Nasal pillows mask. No seasonal rhinitis problems the spring. We discussed of CPAP humidifier. Trazodone helps his restless legs.  11/02/13- 18 yoM followed for OSA, Restless Legs, hx allergy. FOLLOWS FOR: yearly f/u. Pt wearig cpap 7/ Advanced nightly for 9 hours/night. Pt thinks mask is coming off during night d/t daytime sleepiness.   Some sleepy afternoons occasionally but getting enough sleep.  ROS-see HPI Constitutional:   No-   weight loss, night sweats, fevers, chills, fatigue, lassitude. HEENT:   No-  headaches, difficulty swallowing, tooth/dental problems, sore throat,       No-  sneezing, itching, ear ache, nasal congestion, post nasal drip,  CV:  No-   chest pain, orthopnea, PND, swelling in lower extremities, anasarca, dizziness, palpitations Resp: No-   shortness of breath with exertion or at rest.              No-   productive cough,  No non-productive cough,  No- coughing up of blood.              No-   change in color of mucus.  No- wheezing.   Skin: No-   rash or lesions. GI:  +  heartburn, no-indigestion, abdominal pain, nausea, vomiting,  GU:  MS:  No-   joint pain or swelling.  Neuro-     nothing unusual Psych:  No- change in mood or affect. No depression or anxiety.  No memory loss.  OBJ- Physical Exam General- Alert, Oriented, Affect-appropriate, Distress- none acute, overweight, medium build Skin- rash-none, lesions- none, excoriation- none Lymphadenopathy- none Head- atraumatic            Eyes- Gross vision intact, PERRLA, conjunctivae and secretions clear            Ears- Hearing, canals-normal            Nose- Clear, no-Septal dev, mucus, polyps, erosion, perforation  Throat- Mallampati II-III , mucosa-normal, drainage- none, tonsils- atrophic Neck- flexible , trachea midline, no stridor , thyroid nl, carotid no bruit Chest - symmetrical excursion , unlabored           Heart/CV- RRR , no murmur , no gallop  , no rub, nl s1 s2                           - JVD- none , edema- none, stasis changes- none, varices- none           Lung- clear to P&A, wheeze- none, cough- none , dullness-none, rub- none           Chest wall-  Abd-  Br/ Gen/ Rectal- Not done, not indicated Extrem- cyanosis- none, clubbing, none, atrophy- none, strength- nl Neuro- grossly intact to observation,  calm

## 2013-11-02 NOTE — Patient Instructions (Signed)
We can continue CPAP 7, Advanced  Order- referral to orthodontist Dr Oneal Grout- consider oral appliance for OSA  Please call as needed

## 2014-10-11 ENCOUNTER — Telehealth: Payer: Self-pay | Admitting: Internal Medicine

## 2014-10-11 DIAGNOSIS — G4733 Obstructive sleep apnea (adult) (pediatric): Secondary | ICD-10-CM

## 2014-10-11 NOTE — Telephone Encounter (Signed)
Order has been placed for new supplies. Pt is aware. Nothing further was needed.

## 2014-11-08 ENCOUNTER — Encounter: Payer: Self-pay | Admitting: Internal Medicine

## 2014-11-08 ENCOUNTER — Ambulatory Visit (INDEPENDENT_AMBULATORY_CARE_PROVIDER_SITE_OTHER): Payer: PPO | Admitting: Internal Medicine

## 2014-11-08 VITALS — BP 114/72 | HR 75 | Ht 69.0 in | Wt 204.0 lb

## 2014-11-08 DIAGNOSIS — G4733 Obstructive sleep apnea (adult) (pediatric): Secondary | ICD-10-CM

## 2014-11-08 DIAGNOSIS — G2581 Restless legs syndrome: Secondary | ICD-10-CM | POA: Diagnosis not present

## 2014-11-08 MED ORDER — IPRATROPIUM BROMIDE HFA 17 MCG/ACT IN AERS: INHALATION_SPRAY | RESPIRATORY_TRACT | Status: DC

## 2014-11-08 NOTE — Progress Notes (Signed)
Subjective:    Patient ID: Dylan [name], male    DOB: January 22, 1936, 79 y.o.   MRN: [id number]  HPI 10/31/10- 75 yoM followed for OSA, Restless Legs, hx allergy. Last here November 01, 2009 - note reviewed. Continues CPAP 11/ Advanced. Doing well. Discussed replacement supplies and mask in last year. Denies daytime sleepiness. Bedtime is now 10PM Little allergy trouble this year.   11/03/11- 54 yoM followed for OSA, Restless Legs, hx allergy. Wears CPAP every night;"alot of air" coming through mask as well He feels control is good at 11/Advanced. Occasionally snores through the mask. He got his masked from [place]. Allergy-Little pollen related rhinitis this spring.  06/27/12- 89 yoM followed for OSA, Restless Legs, hx allergy. Follows For:  Pt requesting new CPAP machine - Current machine over 5 yrs old and is drying mouth out and wakes up with burning in esophagus  for past 2 weeks - Averages 8 hrs /night on CPAP ?11/ Advanced Download shows best pressure around 8.5 with excellent compliance and control. He is eating peppermint and having heartburn.  07/29/12- 89 yoM followed for OSA, Restless Legs, hx allergy. FOLLOWS FOR: wears CPAP 7/ Advanced every night for about 9 hours on avg. Pressure working well for patient. He adjusts humidifier. Trazodone calms restless legs- taking 1/4 tab twice daily.  Likes Biotene. We discussed potential drying effect of his medicines.  11/01/12- 18 yoM followed for OSA, Restless Legs, hx allergy. FOLLOWS YDX:AJOIN CPAP 7/ Advanced every night for about 8 hours each night. He likes his new CPAP machine. Control and compliance are good. Nasal pillows mask. No seasonal rhinitis problems the spring. We discussed of CPAP humidifier. Trazodone helps his restless legs.  11/02/13- 84 yoM followed for OSA, Restless Legs, hx allergy. FOLLOWS FOR: yearly f/u. Pt wearig cpap 7/ Advanced nightly for 9 hours/night. Pt thinks mask is coming off during night d/t daytime sleepiness.   Some sleepy afternoons occasionally but getting enough sleep.  11/08/14- 8 yoM followed for OSA, Restless Legs, hx allergic rhinitis, complicated by GERD CPAP 7/ Advanced Follows For: yearly f/u. Pt uses CPAP 8-9 hours nightly, maks fits well.  He tried an oral appliance but it caused excessive salivation. He and Dr. Ron Parker are still trying to make it work.  ROS-see HPI Constitutional:   No-   weight loss, night sweats, fevers, chills, fatigue, lassitude. HEENT:   No-  headaches, difficulty swallowing, tooth/dental problems, sore throat,       No-  sneezing, itching, ear ache, nasal congestion, post nasal drip,  CV:  No-   chest pain, orthopnea, PND, swelling in lower extremities, anasarca, dizziness, palpitations Resp: No-   shortness of breath with exertion or at rest.              No-   productive cough,  No non-productive cough,  No- coughing up of blood.              No-   change in color of mucus.  No- wheezing.   Skin: No-   rash or lesions. GI:  +  heartburn, no-indigestion, abdominal pain, nausea, vomiting,  GU:  MS:  No-   joint pain or swelling.  Neuro-     nothing unusual Psych:  No- change in mood or affect. No depression or anxiety.  No memory loss.  OBJ- Physical Exam General- Alert, Oriented, Affect-appropriate, Distress- none acute, overweight, medium build Skin- rash-none, lesions- none, excoriation- none Lymphadenopathy- none Head- atraumatic  Eyes- Gross vision intact, PERRLA, conjunctivae and secretions clear            Ears- Hearing, canals-normal            Nose- Clear, no-Septal dev, mucus, polyps, erosion, perforation             Throat- Mallampati II-III , mucosa-normal, drainage- none, tonsils- atrophic Neck- flexible , trachea midline, no stridor , thyroid nl, carotid no bruit Chest - symmetrical excursion , unlabored           Heart/CV- RRR , no murmur , no gallop  , no rub, nl s1 s2                           - JVD- none , edema- none, stasis  changes- none, varices- none           Lung- clear to P&A, wheeze- none, cough- none , dullness-none, rub- none           Chest wall-  Abd-  Br/ Gen/ Rectal- Not done, not indicated Extrem- cyanosis- none, clubbing, none, atrophy- none, strength- nl Neuro- grossly intact to observation, calm

## 2014-11-08 NOTE — Patient Instructions (Addendum)
We will leave CPAP setting at 7 cwp/ Advanced  I hope you can get the oral appliance to work well for you with Dr Ron Parker' help  For watery mouth, try script printed for Atrovent/ ipratropium spray. Instead of inhaling it, just try spraying it into your open mouth and see if that reduces salivation.

## 2014-12-10 NOTE — Assessment & Plan Note (Signed)
I think he would prefer to use an oral appliance for convenience to consult the excessive salivation issue. Suggested he try Atrovent into his mouth to see if that would dry him comfortably when needed.

## 2014-12-10 NOTE — Assessment & Plan Note (Signed)
He doesn't recognize significant sleep disturbance from leg movements at this time. We discussed better control.

## 2014-12-19 ENCOUNTER — Encounter: Payer: Self-pay | Admitting: Internal Medicine

## 2015-01-22 ENCOUNTER — Telehealth: Payer: Self-pay | Admitting: *Deleted

## 2015-01-22 ENCOUNTER — Ambulatory Visit (INDEPENDENT_AMBULATORY_CARE_PROVIDER_SITE_OTHER): Payer: PPO | Admitting: Neurology

## 2015-01-22 ENCOUNTER — Encounter: Payer: Self-pay | Admitting: Neurology

## 2015-01-22 VITALS — BP 134/76 | HR 75 | Ht 69.0 in | Wt 206.5 lb

## 2015-01-22 DIAGNOSIS — G3184 Mild cognitive impairment, so stated: Secondary | ICD-10-CM | POA: Diagnosis not present

## 2015-01-22 DIAGNOSIS — R413 Other amnesia: Secondary | ICD-10-CM

## 2015-01-22 DIAGNOSIS — R251 Tremor, unspecified: Secondary | ICD-10-CM | POA: Diagnosis not present

## 2015-01-22 DIAGNOSIS — E538 Deficiency of other specified B group vitamins: Secondary | ICD-10-CM | POA: Diagnosis not present

## 2015-01-22 DIAGNOSIS — R5382 Chronic fatigue, unspecified: Secondary | ICD-10-CM | POA: Diagnosis not present

## 2015-01-22 MED ORDER — DONEPEZIL HCL 10 MG PO TABS: 10.0000 mg | ORAL_TABLET | Freq: Every day | ORAL | Status: DC

## 2015-01-22 MED ORDER — DONEPEZIL HCL 5 MG PO TABS: 5.0000 mg | ORAL_TABLET | Freq: Every day | ORAL | Status: DC

## 2015-01-22 NOTE — Patient Instructions (Addendum)
Overall you are doing very well but I do want to suggest a few things today:   Remember to drink plenty of fluid, eat healthy meals and do not skip any meals. Try to eat protein with a every meal and eat a healthy snack such as fruit or nuts in between meals. Try to keep a regular sleep-wake schedule and try to exercise daily, particularly in the form of walking, 20-30 minutes a day, if you can.   As far as your medications are concerned, I would like to suggest: Aricept 5mg  daily then can increase to 10mg  in a month  As far as diagnostic testing: MRi of the brain, labwork  I would like to see you back in 6 months, sooner if we need to. Please call us with any interim questions, concerns, problems, updates or refill requests.   Our phone number is [phone]. We also have an after hours call service for urgent matters and there is a physician on-call for urgent questions. For any emergencies you know to call 911 or go to the nearest emergency room

## 2015-01-22 NOTE — Progress Notes (Addendum)
[facility]    Provider:  Dr Jaynee Eagles Referring Provider: Drenda Freeze, MD Primary Care Physician:  [name], MD  CC:  Memory changes  HPI:  Dylan [name] is a 79 y.o. male here as a referral from Dr. Osborne Casco for memory changes. PMHx HLD, BPH., OSA on cpap, IBS, gerd,anxiety, cystitis Wifre provides most information. Most noticeable driving, he misses turns to places he has gone many times. The first time they noticed it was 2 years ago. It was night time. Progressively getting worse. He notices it as well. He forgets conversations, forgets names of people he just met.  They live independently, pays bills, he remembers major things. Keeping track of appointments. 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 No behavioral issues. No hallucinations, delusions. No shuffling, no parkinsonian symptoms, no tremor. He is less active, his work is his hobbies and he still enjoys running his company. Had lab tests recently and imaging but unsure what kind. No other focal neurologic complaints. No FHx alzheimers.     Reviewed notes, labs and imaging from outside physicians, which showed: CMP, CBC unremarkable, ldl 81, aic 5.5,   Review of Systems: Patient complains of symptoms per HPI as well as the following symptoms: No CP, no SOB. Hearing loss, memory loss, confusion, restless leg Pertinent negatives per HPI. All others negative.   Social History   Social History  . Marital Status: Married    Spouse Name: [name]   . Number of Children: 3  . Years of Education: 12+   Occupational History  . Real Estate-rental properties    Social History Main Topics  . Smoking status: Never Smoker   . Smokeless tobacco: Not on file  . Alcohol Use: 0.0 oz/week    0 Standard drinks or equivalent per week     Comment: Socially   . Drug Use: Not on file  . Sexual Activity: Not on file   Other Topics Concern  . Not on file   Social History  Narrative   Lives at home with wife, [name].   Caffeine use: occasional        Family History  Problem Relation Age of Onset  . Heart disease Father   . Cancer Mother   . Pulmonary embolism Mother     Past Medical History  Diagnosis Date  . Sleep apnea     NPSG 08-03-07 AHI 10/hr, RDI 19.7/hr  . High cholesterol   . Kidney stone     Past Surgical History  Procedure Laterality Date  . Tonsillectomy  1942  . Right clavicle repair  1954    fx  . Wisdom tooth extraction  1961    Current Outpatient Prescriptions  Medication Sig Dispense Refill  . aspirin 81 MG tablet Take 81 mg by mouth daily.      . pentosan polysulfate (ELMIRON) 100 MG capsule Take 100 mg by mouth. One to two times daily     . potassium citrate (UROCIT-K) 10 MEQ (1080 MG) SR tablet Take 2 tablets by mouth daily.  11  . sertraline (ZOLOFT) 50 MG tablet Take 25 mg by mouth Daily.     . simvastatin (ZOCOR) 80 MG tablet Take 80 mg by mouth at bedtime.      . tamsulosin (FLOMAX) 0.4 MG CAPS capsule Take 0.4 mg by mouth daily.  11  . dextromethorphan-guaiFENesin (MUCINEX DM) 30-600 MG per 12 hr tablet Take 1 tablet by mouth 2 (two) times daily  as needed for cough.    Marland Kitchen ipratropium (ATROVENT HFA) 17 MCG/ACT inhaler Spray 1-2 puffs into mouth every 4-6 hours as directed (Patient not taking: Reported on 01/22/2015) 1 Inhaler 12  . saccharomyces boulardii (FLORASTOR) 250 MG capsule Take 250 mg by mouth 2 (two) times daily.     No current facility-administered medications for this visit.    Allergies as of 01/22/2015  . (No Known Allergies)    Vitals: BP 134/76 mmHg  Pulse 75  Ht '5\' 9"'  (1.753 m)  Wt 206 lb 8 oz (93.668 kg)  BMI 30.48 kg/m2 Last Weight:  Wt Readings from Last 1 Encounters:  01/22/15 206 lb 8 oz (93.668 kg)   Last Height:   Ht Readings from Last 1 Encounters:  01/22/15 '5\' 9"'  (1.753 m)     Physical exam: Exam: Gen: NAD, conversant, well nourised, obese, well groomed                     CV:  RRR, no MRG. No Carotid Bruits. No peripheral edema, warm, nontender Eyes: Conjunctivae clear without exudates or hemorrhage  Neuro: Detailed Neurologic Exam  Speech:    Speech is normal; fluent and spontaneous with normal comprehension.  Cognition: MoCA 23/30 ( -1 visuospatial execution, -1 language, -4 delayed recall, -1 orientation)    The patient is oriented to person, place, and time;     recent memory impaired and remote memory intact;     language fluent;     normal attention, concentration,     fund of knowledge appears grossly intact Cranial Nerves:    The pupils are equal, round, and reactive to light. The fundi are normal and spontaneous venous pulsations are present. Visual fields are full to finger confrontation. Extraocular movements are intact. Trigeminal sensation is intact and the muscles of mastication are normal. The face is symmetric. The palate elevates in the midline. Hearing intact to voice. Voice is normal. Shoulder shrug is normal. The tongue has normal motion without fasciculations.   Coordination:    Normal finger to nose and heel to shin. Normal rapid alternating movements.   Gait:    Heel-toe and tandem gait are normal.   Motor Observation:    Mild postural tremor Tone:    Normal muscle tone.    Posture:    Posture is normal. normal erect    Strength:    Strength is V/V in the upper and lower limbs.      Sensation: intact to LT     Reflex Exam:  DTR's:    Deep tendon reflexes in the upper and lower extremities are symmetrical bilaterally.   Toes:    The toes are downgoing bilaterally.   Clonus:    Clonus is absent.     Assessment/Plan:   79 y.o. male here as a referral from Dr. Osborne Casco for memory changes. PMHx HLD, BPH., OSA on cpap, IBS, gerd,anxiety, cystitis. MoCA 23/30.  Memory changes: MRI of the brain, B12 and Folate, TSH, Will start Aricept Decreased energy: TSH Follow up in 6 months  Sarina Ill, MD  [facility] [address] [place], [place] [address]  Phone [phone] Fax [phone]

## 2015-01-22 NOTE — Telephone Encounter (Signed)
Tracy/ [facility] called to advise that they do not have MRI for patient. You can call her back at [phone] she will be at lunch from 12pm-1pm but you can leave a message.

## 2015-01-22 NOTE — Telephone Encounter (Signed)
Please let patient know he did not have an MRI of the brain and so I have ordered one. We will be contacting him shortly to arrange. Thank you.

## 2015-01-22 NOTE — Telephone Encounter (Addendum)
Left message for medical records department to call me back about pt having any recent MRI brain. Called and left message for Olivia Mackie at [facility]. Gave pt name and DOB twice. Gave [facility] phone number and office hours.

## 2015-01-23 ENCOUNTER — Telehealth: Payer: Self-pay | Admitting: *Deleted

## 2015-01-23 LAB — TSH: TSH: 2.36 u[IU]/mL (ref 0.450–4.500)

## 2015-01-23 LAB — B12 AND FOLATE PANEL
Folate: 11.3 ng/mL (ref >3.0)
Vitamin B-12: 360 pg/mL (ref 211–946)

## 2015-01-23 NOTE — Telephone Encounter (Signed)
Spoke w/ pt about normal lab results. He verbalized understanding.

## 2015-01-23 NOTE — Telephone Encounter (Signed)
-----   Message from Melvenia Beam, MD sent at 01/23/2015 12:03 PM EDT ----- Please let patient know his TSH and b12 with folate were normal. Thank you.

## 2015-01-23 NOTE — Telephone Encounter (Signed)
Called and spoke w/ pt to let him know he did not have MRI brain done and [name] ordered one. He should hear from scheduling department soon to arrange that. He verbalized understanding.

## 2015-01-24 ENCOUNTER — Encounter: Payer: Self-pay | Admitting: Neurology

## 2015-01-24 DIAGNOSIS — R413 Other amnesia: Secondary | ICD-10-CM | POA: Insufficient documentation

## 2015-01-29 ENCOUNTER — Ambulatory Visit
Admission: RE | Admit: 2015-01-29 | Discharge: 2015-01-29 | Disposition: A | Discharge status: Home / Self Care | Payer: PPO | Source: Ambulatory Visit | Attending: Neurology | Admitting: Neurology

## 2015-01-29 DIAGNOSIS — R413 Other amnesia: Secondary | ICD-10-CM

## 2015-01-29 DIAGNOSIS — R251 Tremor, unspecified: Secondary | ICD-10-CM

## 2015-02-05 ENCOUNTER — Telehealth: Payer: Self-pay | Admitting: Neurology

## 2015-02-05 NOTE — Telephone Encounter (Signed)
Spoke to patient and his wife abou MRi. They would like to come in and discuss. Terrence Dupont can you call them and make an appointment next week for 30 minutes?    IMPRESSION:  Abnormal MRI brain (without) demonstrating: 1. Mild chronic small vessel ischemic disease.  2. At least 7 punctate cerebral microhemorrhages noted in the bilateral cerebral hemispheres. This could be related to underlying amyloid-based pathology vs chronic small vessel ischemic disease spectrum. 3. No acute findings.

## 2015-02-06 NOTE — Telephone Encounter (Signed)
Spoke w/ pt wife. Made f/u appt for 02/11/15 to discuss MRI results. Told her to arrive 15 min prior to appt time. She verbalized understanding. Gave [facility] phone number if she had further questions.

## 2015-02-11 ENCOUNTER — Ambulatory Visit (INDEPENDENT_AMBULATORY_CARE_PROVIDER_SITE_OTHER): Payer: PPO | Admitting: Neurology

## 2015-02-11 ENCOUNTER — Encounter: Payer: Self-pay | Admitting: Neurology

## 2015-02-11 VITALS — BP 132/75 | HR 71 | Ht 69.0 in | Wt 207.0 lb

## 2015-02-11 DIAGNOSIS — R930 Abnormal findings on diagnostic imaging of skull and head, not elsewhere classified: Secondary | ICD-10-CM | POA: Diagnosis not present

## 2015-02-11 DIAGNOSIS — G3184 Mild cognitive impairment, so stated: Secondary | ICD-10-CM

## 2015-02-11 DIAGNOSIS — E854 Organ-limited amyloidosis: Secondary | ICD-10-CM

## 2015-02-11 DIAGNOSIS — I680 Cerebral amyloid angiopathy: Secondary | ICD-10-CM | POA: Diagnosis not present

## 2015-02-11 DIAGNOSIS — R9082 White matter disease, unspecified: Secondary | ICD-10-CM

## 2015-02-11 MED ORDER — DONEPEZIL HCL 10 MG PO TABS: 10.0000 mg | ORAL_TABLET | Freq: Every day | ORAL | Status: DC

## 2015-02-11 NOTE — Progress Notes (Signed)
[facility]    Provider:  Dr Jaynee Eagles Referring Provider: Drenda Freeze, MD Primary Care Physician:  [name], MD  CC: Memory changes  Interval History 02/11/2015: Patient returns today to discuss MRI of the brain.  MRI of the brain showed nonspecific white matter changes as well as possible cerebral amyloid angiopathy. Small subclinical leaks microhemorrhages are seen in cerebral amyloid angiopathy as was seen in the MRI of his brain. Discussion with patient and his wife about cerebral amyloid angiopathy, amyloid beta peptide protein deposits in the smaller sized blood vessels of the brain. The biggest risk factor for this disorder his age. There is a risk for intracerebral hemorrhage with this condition. Unfortunately spontaneous lobar hemorrhage can be a manifestation of this disorder. There can also be neurologic symptoms in association with this disorder. I suggested avoiding anticoagulants and antiplatelet agents. Control of blood pressure is always advisable. Patient and his wife would like to see a stroke specialist to discuss this further. I recommended them to my colleague Dr. Erlinda Hong.    MRI brain 01/31/2015:  Abnormal MRI brain (without) demonstrating: 1. Mild chronic small vessel ischemic disease.  2. At least 7 punctate cerebral microhemorrhages noted in the bilateral cerebral hemispheres. This could be related to underlying amyloid-based pathology vs chronic small vessel ischemic disease spectrum. 3. No acute findings.  HPI: Dylan [name] is a 79 y.o. male here as a referral from Dr. Osborne Casco for memory changes. PMHx HLD, BPH., OSA on cpap, IBS, gerd,anxiety, cystitis Wifre provides most information. Most noticeable driving, he misses turns to places he has gone many times. The first time they noticed it was 2 years ago. It was night time. Progressively getting worse. He notices it as well. He forgets conversations, forgets names of people he just met. They  live independently, pays bills, he remembers major things. Keeping track of appointments. 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 No behavioral issues. No hallucinations, delusions. No shuffling, no parkinsonian symptoms, no tremor. He is less active, his work is his hobbies and he still enjoys running his company. Had lab tests recently and imaging but unsure what kind. No other focal neurologic complaints. No FHx alzheimers.   Reviewed notes, labs and imaging from outside physicians, which showed: CMP, CBC unremarkable, ldl 81, aic 5.5,    Review of Systems: Patient complains of symptoms per HPI as well as the following symptoms: Endorses Stress, no chest pain, no shortness of breath. Pertinent negatives per HPI. All others negative.   Social History   Social History  . Marital Status: Married    Spouse Name: Dylan [name]   . Number of Children: 3  . Years of Education: 12+   Occupational History  . Real Estate-rental properties    Social History Main Topics  . Smoking status: Never Smoker   . Smokeless tobacco: Not on file  . Alcohol Use: 0.0 oz/week    0 Standard drinks or equivalent per week     Comment: Socially   . Drug Use: Not on file  . Sexual Activity: Not on file   Other Topics Concern  . Not on file   Social History Narrative   Lives at home with wife, Dylan [name].   Caffeine use: occasional        Family History  Problem Relation Age of Onset  . Heart disease Father   . Cancer Mother   . Pulmonary embolism Mother     Past Medical History  Diagnosis Date  . Sleep apnea     NPSG 08-03-07 AHI 10/hr, RDI 19.7/hr  . High cholesterol   . Kidney stone     Past Surgical History  Procedure Laterality Date  . Tonsillectomy  1942  . Right clavicle repair  1954    fx  . Wisdom tooth extraction  1961    Current Outpatient Prescriptions  Medication Sig Dispense Refill  . aspirin 81 MG tablet Take 81 mg by mouth every other day.      Marland Kitchen dextromethorphan-guaiFENesin (MUCINEX DM) 30-600 MG per 12 hr tablet Take 1 tablet by mouth 2 (two) times daily as needed for cough.    . donepezil (ARICEPT) 10 MG tablet Take 1 tablet (10 mg total) by mouth at bedtime. 30 tablet 11  . ipratropium (ATROVENT HFA) 17 MCG/ACT inhaler Spray 1-2 puffs into mouth every 4-6 hours as directed 1 Inhaler 12  . pentosan polysulfate (ELMIRON) 100 MG capsule Take 100 mg by mouth. One to two times daily     . potassium citrate (UROCIT-K) 10 MEQ (1080 MG) SR tablet Take 2 tablets by mouth daily.  11  . sertraline (ZOLOFT) 50 MG tablet Take 25 mg by mouth Daily.     . simvastatin (ZOCOR) 80 MG tablet Take 80 mg by mouth at bedtime.      . tamsulosin (FLOMAX) 0.4 MG CAPS capsule Take 0.4 mg by mouth daily.  11   No current facility-administered medications for this visit.    Allergies as of 02/11/2015  . (No Known Allergies)    Vitals: BP 132/75 mmHg  Pulse 71  Ht 5' 9" (1.753 m)  Wt 207 lb (93.895 kg)  BMI 30.55 kg/m2 Last Weight:  Wt Readings from Last 1 Encounters:  02/11/15 207 lb (93.895 kg)   Last Height:   Ht Readings from Last 1 Encounters:  02/11/15 5' 9" (1.753 m)     Physical exam: Exam: Gen: NAD, conversant, well nourised, obese, well groomed  CV: RRR, no MRG. No Carotid Bruits. No peripheral edema, warm, nontender Eyes: Conjunctivae clear without exudates or hemorrhage  Neuro: Detailed Neurologic Exam  Speech:  Speech is normal; fluent and spontaneous with normal comprehension.  Cognition: MoCA 23/30 ( -1 visuospatial execution, -1 language, -4 delayed recall, -1 orientation)  The patient is oriented to person, place, and time;   recent memory impaired and remote memory intact;   language fluent;   normal attention, concentration,   fund of knowledge appears grossly intact Cranial Nerves:  The pupils are equal, round, and reactive to light. The fundi are normal and spontaneous  venous pulsations are present. Visual fields are full to finger confrontation. Extraocular movements are intact. Trigeminal sensation is intact and the muscles of mastication are normal. The face is symmetric. The palate elevates in the midline. Hearing intact to voice. Voice is normal. Shoulder shrug is normal. The tongue has normal motion without fasciculations.   Coordination:  Normal finger to nose and heel to shin. Normal rapid alternating movements.   Gait:  Heel-toe and tandem gait are normal.   Motor Observation:  Mild postural tremor Tone:  Normal muscle tone.   Posture:  Posture is normal. normal erect   Strength:  Strength is V/V in the upper and lower limbs.    Sensation: intact to LT   Reflex Exam:  DTR's:  Deep tendon reflexes in the upper and lower extremities are symmetrical bilaterally.  Toes:  The toes are downgoing bilaterally.  Clonus:  Clonus  is absent.   Assessment/Plan: 79 y.o. male here as a referral from Dr. Osborne Casco for memory changes. PMHx HLD, BPH., OSA on cpap, IBS, gerd,anxiety, cystitis. MoCA 23/30. MRI of the brain showed white matter changes that are nonspecific and possible cerebral amyloid angiopathy. Had a long discussion with patient and his wife considering this disorder. Unfortunately spontaneous lobar hemorrhage can be a manifestation of this disorder. There can also be neurologic symptoms in association with this disorder. I suggested avoiding anticoagulants and antiplatelet agents. Control of blood pressure is always advisable.  Patient and his wife would like to discuss his condition with a stroke physician. I have recommended my wonderful colleague Dr. Erlinda Hong.   Patient and his wife would feel more comfortable with imaging of the blood vessels. I think this is reasonable. I will order a CTA of the head and neck.    Sarina Ill, MD  [facility] [address] [place], [place] [address]  Phone [phone] Fax [phone]  A total of 60 minutes was spent face-to-face with this patient. Over half this time was spent on counseling patient on the cerebral amyloid angiopathy diagnosis and different diagnostic and therapeutic options available.

## 2015-02-12 ENCOUNTER — Telehealth: Payer: Self-pay | Admitting: Neurology

## 2015-02-12 NOTE — Telephone Encounter (Signed)
Nia with GI is calling about the orders for patient as she needs to know if patient needs both orders: she has orders for CT Angio neck, with or w/o; CT Angio head, with or without contrast. Please call.

## 2015-02-12 NOTE — Telephone Encounter (Signed)
I think this question is for [name]? She ordered the tests. Thanks.  Rosalin Hawking, MD PhD Stroke Neurology 02/12/2015 4:44 PM

## 2015-02-13 DIAGNOSIS — E854 Organ-limited amyloidosis: Secondary | ICD-10-CM | POA: Insufficient documentation

## 2015-02-13 DIAGNOSIS — I680 Cerebral amyloid angiopathy: Secondary | ICD-10-CM

## 2015-02-14 NOTE — Telephone Encounter (Signed)
Yes I would like both orders. Would you call Cassell Smiles back please Terrence Dupont? Would you ask her if there is a problem with hte orders? thnk you!

## 2015-02-14 NOTE — Telephone Encounter (Signed)
Thanks

## 2015-02-14 NOTE — Telephone Encounter (Signed)
Spoke with Anderson Malta from [facility]. Clarified with her that [name] wants both the CT angio neck and CT angio head w/ and w/o contrast. She stated Dylan [name] was not in the office today and will leave the message to go ahead and schedule both.

## 2015-02-20 ENCOUNTER — Other Ambulatory Visit: Payer: Self-pay | Admitting: Neurology

## 2015-02-20 ENCOUNTER — Telehealth: Payer: Self-pay | Admitting: Neurology

## 2015-02-20 MED ORDER — DONEPEZIL HCL 5 MG PO TABS: 5.0000 mg | ORAL_TABLET | Freq: Every day | ORAL | Status: DC

## 2015-02-20 NOTE — Telephone Encounter (Signed)
Called it in; thanks

## 2015-02-20 NOTE — Telephone Encounter (Signed)
Called and spoke w/ pt. He denies fever and stated these symptoms started after starting to take 10mg  Aricept. Advised that [name] would like him to go back to taking 5mg . He stated he does not have any left and needs a new prescription. I advised that I will let [name] know and will call him tomorrow to let him know about prescription. He verbalized understanding. Advised he should go to PCP if decreasing dose does not help and if he is still experiencing loose bowels/stomach pain/lose of control of bladder. Might be something else going on.

## 2015-02-20 NOTE — Telephone Encounter (Signed)
Patient called stating has loose bowels going 3-4 x day, stomach pain, loosing control of bladder-leaking since increasing dose to 10mg   donepezil (ARICEPT) 10 MG tablet 10mg  on 02/11/15. He said when taking 5mg  he did not have these symptoms. Please call and advise at [phone].

## 2015-02-20 NOTE — Telephone Encounter (Signed)
Spoke w/ [name] and she advised to have pt going back to 5mg . And we will try 10mg  at a later time.

## 2015-02-21 NOTE — Telephone Encounter (Signed)
Called to let pt know that [name] called in Rx for Aricept 5mg  to his pharmacy. He verbalized understanding and stated he already picked it up. He is feeling a little better. No longer having stomach pain. He is not sure about his bladder yet, stating it happens more during the day. Told him to call back if he has further questions/worseing symptoms. He verbalized understanding.

## 2015-02-22 ENCOUNTER — Other Ambulatory Visit: Payer: Self-pay | Admitting: Neurology

## 2015-02-22 LAB — BUN: BUN: 19 mg/dL (ref 7–25)

## 2015-02-22 LAB — CREATININE, SERUM: CREATININE: 1.07 mg/dL (ref 0.70–1.18)

## 2015-02-25 ENCOUNTER — Inpatient Hospital Stay: Admission: RE | Admit: 2015-02-25 | Payer: PPO | Source: Ambulatory Visit

## 2015-02-26 ENCOUNTER — Telehealth: Payer: Self-pay | Admitting: *Deleted

## 2015-02-26 NOTE — Telephone Encounter (Signed)
-----   Message from Melvenia Beam, MD sent at 02/24/2015  9:24 AM EDT ----- Labs normal, thanks

## 2015-02-26 NOTE — Telephone Encounter (Signed)
Spoke w/ wife about normal lab work. She verbalized understanding. Dylan [name] he has not gone for CT yet because he had eaten the day he was supposed to go for it and had to reschedule for 03/04/15.

## 2015-03-04 ENCOUNTER — Ambulatory Visit
Admission: RE | Admit: 2015-03-04 | Discharge: 2015-03-04 | Disposition: A | Discharge status: Home / Self Care | Payer: PPO | Source: Ambulatory Visit | Attending: Neurology | Admitting: Neurology

## 2015-03-04 DIAGNOSIS — G3184 Mild cognitive impairment, so stated: Secondary | ICD-10-CM

## 2015-03-04 DIAGNOSIS — R9082 White matter disease, unspecified: Secondary | ICD-10-CM

## 2015-03-04 MED ORDER — IOPAMIDOL (ISOVUE-370) INJECTION 76%
100.0000 mL | Freq: Once | INTRAVENOUS | Status: AC | PRN
  Administered 2015-03-04: 100 mL via INTRAVENOUS

## 2015-03-06 ENCOUNTER — Telehealth: Payer: Self-pay | Admitting: *Deleted

## 2015-03-06 NOTE — Telephone Encounter (Signed)
-----   Message from Melvenia Beam, MD sent at 03/06/2015 12:27 PM EDT ----- Dylan [name] - please let patient know that overall the blood vessels in his neck and head were unremarkable for age. There is some mild atherosclerotic plaque buildup in the smaller arteries supplying blood to the brain but nothing causing significant blockage. He has an appointment with Dr. Erlinda Hong tomorrow and he can go over it with him. thanks

## 2015-03-06 NOTE — Telephone Encounter (Signed)
Spoke with pt wife about CT results. Advised per [name] that blood vessels in neck and head are unremarkable for his age. There is some mild atherosclerotic plaque buildup in the smaller arteries supplying blood to the brain but nothing causing significant blockage. He has appt with Dr. Erlinda Hong tomorrow and they can go over results with him tomorrow if needed. She verbalized understanding.

## 2015-03-07 ENCOUNTER — Ambulatory Visit (INDEPENDENT_AMBULATORY_CARE_PROVIDER_SITE_OTHER): Payer: PPO | Admitting: Neurology

## 2015-03-07 ENCOUNTER — Encounter: Payer: Self-pay | Admitting: Neurology

## 2015-03-07 VITALS — BP 133/79 | HR 64 | Ht 69.0 in | Wt 205.2 lb

## 2015-03-07 DIAGNOSIS — E538 Deficiency of other specified B group vitamins: Secondary | ICD-10-CM

## 2015-03-07 DIAGNOSIS — G3184 Mild cognitive impairment, so stated: Secondary | ICD-10-CM

## 2015-03-07 NOTE — Patient Instructions (Signed)
-   ASA 81 every the other day is OK with me. - would not consider amyloid angiopathy at this time, it is too mild at this time for the condition. Would like to repeat brain MRI in one year for follow up  - follow up with [name] for memory monitoring - decrease stress level and relax - check BP at home, keep BP < 140

## 2015-03-08 DIAGNOSIS — G3184 Mild cognitive impairment, so stated: Secondary | ICD-10-CM | POA: Insufficient documentation

## 2015-03-08 DIAGNOSIS — E538 Deficiency of other specified B group vitamins: Secondary | ICD-10-CM | POA: Insufficient documentation

## 2015-03-08 NOTE — Progress Notes (Signed)
STROKE NEUROLOGY FOLLOW UP NOTE  NAME: Dylan [name] DOB: [birth year]  REASON FOR VISIT: stroke follow up HISTORY FROM: pt and chart  Today we had the pleasure of seeing Dylan [name] in follow-up at our Neurology Clinic. Pt was accompanied by wife.   History Summary Initial visit 01/22/15 (AA) - Dylan [name] is a 79 y.o. male here as a referral from Dr. Osborne Casco for memory changes. PMHx HLD, BPH., OSA on cpap, IBS, gerd,anxiety, cystitis Wifre provides most information. Most noticeable driving, he misses turns to places he has gone many times. The first time they noticed it was 2 years ago. It was night time. Progressively getting worse. He notices it as well. He forgets conversations, forgets names of people he just met. They live independently, pays bills, he remembers major things. Keeping track of appointments. 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 No behavioral issues. No hallucinations, delusions. No shuffling, no parkinsonian symptoms, no tremor. He is less active, his work is his hobbies and he still enjoys running his company. Had lab tests recently and imaging but unsure what kind. No other focal neurologic complaints. No FHx alzheimers.  Follow up 02/11/15 (AA) - Patient returns today to discuss MRI of the brain. MRI of the brain showed nonspecific white matter changes as well as possible cerebral amyloid angiopathy. Small subclinical leaks microhemorrhages are seen in cerebral amyloid angiopathy as was seen in the MRI of his brain. Discussion with patient and his wife about cerebral amyloid angiopathy, amyloid beta peptide protein deposits in the smaller sized blood vessels of the brain. The biggest risk factor for this disorder his age. There is a risk for intracerebral hemorrhage with this condition. Unfortunately spontaneous lobar hemorrhage can be a manifestation of this disorder. There can also be neurologic symptoms in association  with this disorder. I suggested avoiding anticoagulants and antiplatelet agents. Control of blood pressure is always advisable. Patient and his wife would like to see a stroke specialist to discuss this further. I recommended them to my colleague Dr. Erlinda Hong.   Interval History During the interval time, the patient has been doing the same. He had MRI brain on 01/31/15 showed 7 punctate cerebral microhemorrhages noted in the bilateral cerebral hemispheres. As reported "this could be related to underlying amyloid-based pathology vs. Chronic small vessel ischemic disease spectrum". He also had a CTA head and neck which was unremarkable. He denies any ischemic or hemorrhagic stroke in the past. Currently on Aricept, just increased from 5 mg to 10 mg daily. No side effects. He is on aspirin 81 mg every other day and Zocor for stroke prevention. His symptoms very anxious about amyloid angiopathy diagnosis and worry about bleeding risk and memory loss. Blood pressure today 133/79. He has no history of hypertension and is not on any blood pressure medication.  REVIEW OF SYSTEMS: Full 14 system review of systems performed and notable only for those listed below and in HPI above, all others are negative:  Constitutional:   Cardiovascular:  Ear/Nose/Throat:  Hearing loss Skin:  Eyes:   Respiratory:   Gastroitestinal:   Genitourinary:  Hematology/Lymphatic:   Endocrine:  Musculoskeletal:  Cramps Allergy/Immunology:   Neurological:  Memory loss Psychiatric: Decreased energy Sleep: Snoring, restless leg  The following represents the patient's updated allergies and side effects list: No Known Allergies  The neurologically relevant items on the patient's problem list were reviewed on today's visit.  Neurologic Examination  A problem focused  neurological exam (12 or more points of the single system neurologic examination, vital signs counts as 1 point, cranial nerves count for 8 points) was performed.  Blood  pressure 133/79, pulse 64, height _0  (1.753 m), weight 205 lb 3.2 oz (93.078 kg).  General - Well nourished, well developed, in no apparent distress.  Ophthalmologic - Sharp disc margins OU.   Cardiovascular - Regular rate and rhythm with no murmur.  Mental Status -  Level of arousal and orientation to time, place, and person were intact. Language including expression, naming, repetition, comprehension was assessed and found intact. Fund of Knowledge was assessed and was intact.  Cranial Nerves II - XII - II - Visual field intact OU. III, IV, VI - Extraocular movements intact. V - Facial sensation intact bilaterally. VII - Facial movement intact bilaterally. VIII - Hearing & vestibular intact bilaterally. X - Palate elevates symmetrically. XI - Chin turning & shoulder shrug intact bilaterally. XII - Tongue protrusion intact.  Motor Strength - The patient's strength was normal in all extremities and pronator drift was absent.  Bulk was normal and fasciculations were absent.   Motor Tone - Muscle tone was assessed at the neck and appendages and was normal.  Reflexes - The patient's reflexes were 1+ in all extremities and he had no pathological reflexes.  Sensory - Light touch, temperature/pinprick, vibration and proprioception, and Romberg testing were assessed and were normal.    Coordination - The patient had normal movements in the hands and feet with no ataxia or dysmetria.  Tremor was absent.  Gait and Station - The patient's transfers, posture, gait, station, and turns were observed as normal.  Data reviewed: I personally reviewed the images and agree with the radiology interpretations.  MRI brain 01/31/2015:  Abnormal MRI brain (without) demonstrating: 1. Mild chronic small vessel ischemic disease.  2. At least 7 punctate cerebral microhemorrhages noted in the bilateral cerebral hemispheres. This could be related to underlying amyloid-based pathology vs chronic small  vessel ischemic disease spectrum. 3. No acute findings.  CTA head and neck 03/04/15  1. Mild to moderate distal small vessel irregularity compatible with intracranial atherosclerotic disease versus less likely vasculitis. 2. No significant proximal stenosis, aneurysm, or branch vessel occlusion. 3. Mild atherosclerotic changes at the carotid bifurcations, worse on the left. There is no significant stenosis. 4. Minimal atherosclerotic calcifications at the origin of the subclavian arteries bilaterally. 5. Moderate spondylosis of the cervical spine is most evident at C6-7  Component     Latest Ref Rng 01/22/2015  Vitamin B-12     211 - 946 pg/mL 360  Folate     >3.0 ng/mL 11.3  TSH     0.450 - 4.500 uIU/mL 2.360    Assessment: As you may recall, he is a 79 y.o. Caucasian male with PMH of HLD, BPH., OSA on cpap, IBS, anxiety, and cystitis follow up in clinic for memory loss. Today in clinic for second opinion of microbleeding found in MRI. There are 7 total punctate MBs in bilateral hemisphere. The MBs are more cortical based, but also involving WMs. However, population based study showed that MBs are common in elderly patient without developing CAA. Especially with limited number of MBs, it is too early to say it could be CAA. Would continue monitor MBs with annual MRIs. ASA 23m once the other day is acceptable. However, BP control may be more of benefit.   Plan:  - ASA 81 every the other day is OK. - would  not consider diagnosis of CAA at this time. Recommend to repeat brain MRI in one year for follow up  - check BP at home, keep BP < 140 - follow up with [name] for memory monitoring  No orders of the defined types were placed in this encounter.    No orders of the defined types were placed in this encounter.    Patient Instructions  - ASA 81 every the other day is OK with me. - would not consider amyloid angiopathy at this time, it is too mild at this time for the  condition. Would like to repeat brain MRI in one year for follow up  - follow up with [name] for memory monitoring - decrease stress level and relax - check BP at home, keep BP < 140    Rosalin Hawking, MD PhD [facility] [address] [place], [place] [address] [phone]

## 2015-03-26 ENCOUNTER — Telehealth: Payer: Self-pay | Admitting: Student

## 2015-03-26 NOTE — Telephone Encounter (Signed)
Pt called office back. He stated he is taking the 10mg  Aricept and did not go back to 5mg . He was a little confused. I advised that I spoke with him back on 9/21 to let him know to start taking the 5mg  tablet again and [name] called in new Rx. He said he thinks he remembers this. He would like [name] to call him to discuss his options. I told him I will let her know. He verbalized understanding.

## 2015-03-26 NOTE — Telephone Encounter (Signed)
Pt called and says he is taking donepezil (ARICEPT) 5 MG tablet and started to have diarrhea and is wondering if it is a side effect. Please call and advise [phone].

## 2015-03-26 NOTE — Telephone Encounter (Signed)
LVM returning pt call. Gave [facility] phone number for him to call back.

## 2015-03-26 NOTE — Telephone Encounter (Signed)
Pt returned call. Please call and advise °

## 2015-03-26 NOTE — Telephone Encounter (Signed)
Tried calling pt back. Mailbox full and cannot leave a VM.

## 2015-03-27 NOTE — Telephone Encounter (Signed)
Called back and left messages at home and work. If he was having side effects to the 10mg , I advise he stay on the 5mg  for a while and then we can try again. If he is on the 10mg  and doing well he can stay on that thanks. If he is not tolerating either dose, we can switch to a different medication thanks

## 2015-03-28 NOTE — Telephone Encounter (Signed)
I called and LMVM for pt that if he still needed [place] to return call.  I noted that [name] had LM on his VM previously.

## 2015-07-18 DIAGNOSIS — Z125 Encounter for screening for malignant neoplasm of prostate: Secondary | ICD-10-CM | POA: Diagnosis not present

## 2015-07-18 DIAGNOSIS — R7302 Impaired glucose tolerance (oral): Secondary | ICD-10-CM | POA: Diagnosis not present

## 2015-07-18 DIAGNOSIS — R8290 Unspecified abnormal findings in urine: Secondary | ICD-10-CM | POA: Diagnosis not present

## 2015-07-18 DIAGNOSIS — E7800 Pure hypercholesterolemia, unspecified: Secondary | ICD-10-CM | POA: Diagnosis not present

## 2015-07-25 DIAGNOSIS — Z0000 Encounter for general adult medical examination without abnormal findings: Secondary | ICD-10-CM | POA: Diagnosis not present

## 2015-07-25 DIAGNOSIS — D692 Other nonthrombocytopenic purpura: Secondary | ICD-10-CM | POA: Diagnosis not present

## 2015-07-25 DIAGNOSIS — Z1389 Encounter for screening for other disorder: Secondary | ICD-10-CM | POA: Diagnosis not present

## 2015-07-25 DIAGNOSIS — G4762 Sleep related leg cramps: Secondary | ICD-10-CM | POA: Diagnosis not present

## 2015-07-25 DIAGNOSIS — I4510 Unspecified right bundle-branch block: Secondary | ICD-10-CM | POA: Diagnosis not present

## 2015-07-25 DIAGNOSIS — R972 Elevated prostate specific antigen [PSA]: Secondary | ICD-10-CM | POA: Diagnosis not present

## 2015-07-25 DIAGNOSIS — L570 Actinic keratosis: Secondary | ICD-10-CM | POA: Diagnosis not present

## 2015-07-25 DIAGNOSIS — N200 Calculus of kidney: Secondary | ICD-10-CM | POA: Diagnosis not present

## 2015-07-25 DIAGNOSIS — R413 Other amnesia: Secondary | ICD-10-CM | POA: Diagnosis not present

## 2015-07-25 DIAGNOSIS — K219 Gastro-esophageal reflux disease without esophagitis: Secondary | ICD-10-CM | POA: Diagnosis not present

## 2015-07-25 DIAGNOSIS — N5201 Erectile dysfunction due to arterial insufficiency: Secondary | ICD-10-CM | POA: Diagnosis not present

## 2015-07-25 DIAGNOSIS — Z6828 Body mass index (BMI) 28.0-28.9, adult: Secondary | ICD-10-CM | POA: Diagnosis not present

## 2015-07-30 ENCOUNTER — Ambulatory Visit (INDEPENDENT_AMBULATORY_CARE_PROVIDER_SITE_OTHER): Payer: PPO | Admitting: Neurology

## 2015-07-30 ENCOUNTER — Encounter: Payer: Self-pay | Admitting: Neurology

## 2015-07-30 VITALS — BP 135/79 | HR 63 | Ht 69.0 in | Wt 202.8 lb

## 2015-07-30 DIAGNOSIS — G3184 Mild cognitive impairment, so stated: Secondary | ICD-10-CM | POA: Diagnosis not present

## 2015-07-30 DIAGNOSIS — R413 Other amnesia: Secondary | ICD-10-CM | POA: Diagnosis not present

## 2015-07-30 NOTE — Patient Instructions (Signed)
Remember to drink plenty of fluid, eat healthy meals and do not skip any meals. Try to eat protein with a every meal and eat a healthy snack such as fruit or nuts in between meals. Try to keep a regular sleep-wake schedule and try to exercise daily, particularly in the form of walking, 20-30 minutes a day, if you can.   As far as your medications are concerned, I would like to suggest: Continue Rivastigmine. If you have side effects we can decrease the dose.  As far as diagnostic testing: Neurocognitive testing  I would like to see you back about 2 weeks about neurocognitive testing, sooner if we need to. Please call us with any interim questions, concerns, problems, updates or refill requests.   Our phone number is [phone]. We also have an after hours call service for urgent matters and there is a physician on-call for urgent questions. For any emergencies you know to call 911 or go to the nearest emergency room

## 2015-07-30 NOTE — Progress Notes (Signed)
[facility]    Provider:  Dr Jaynee Eagles Referring Provider: [name], MD Primary Care Physician:  [name], MD  CC: Memory changes  Interval history 07/30/2015: 80 year old highly functional male with memory changes.  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 No behavioral issues. No hallucinations, delusions. No shuffling, no parkinsonian symptoms, no tremor. Memory is a little worse per wife. His stress level has been very high. The last month.At this time she saw more "slipping: because he had so much on his mind. He couldn't have a conversation that was complete. Had diarrhea with Aricept and was placed on a patch last week. Rivastigmine.  He has OSA and is compliant with his mask  Will refer for neurocognitive testing. He is having headaches and ringing on the ears   Interval History 02/11/2015: Patient returns today to discuss MRI of the brain. MRI of the brain showed nonspecific white matter changes as well as possible cerebral amyloid angiopathy. Small subclinical leaks microhemorrhages are seen in cerebral amyloid angiopathy as was seen in the MRI of his brain. Discussion with patient and his wife about cerebral amyloid angiopathy, amyloid beta peptide protein deposits in the smaller sized blood vessels of the brain. The biggest risk factor for this disorder his age. There is a risk for intracerebral hemorrhage with this condition. Unfortunately spontaneous lobar hemorrhage can be a manifestation of this disorder. There can also be neurologic symptoms in association with this disorder. I suggested avoiding anticoagulants and antiplatelet agents. Control of blood pressure is always advisable. Patient and his wife would like to see a stroke specialist to discuss this further. I recommended them to my colleague Dr. Erlinda Hong.    MRI brain 01/31/2015:  Abnormal MRI brain (without) demonstrating: 1. Mild chronic small vessel  ischemic disease.  2. At least 7 punctate cerebral microhemorrhages noted in the bilateral cerebral hemispheres. This could be related to underlying amyloid-based pathology vs chronic small vessel ischemic disease spectrum. 3. No acute findings.  HPI: Dylan [name] is a 80 y.o. male here as a referral from Dr. Osborne Casco for memory changes. PMHx HLD, BPH., OSA on cpap, IBS, gerd,anxiety, cystitis Wifre provides most information. Most noticeable driving, he misses turns to places he has gone many times. The first time they noticed it was 2 years ago. It was night time. Progressively getting worse. He notices it as well. He forgets conversations, forgets names of people he just met. They live independently, pays bills, he remembers major things. Keeping track of appointments. 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 No behavioral issues. No hallucinations, delusions. No shuffling, no parkinsonian symptoms, no tremor. He is less active, his work is his hobbies and he still enjoys running his company. Had lab tests recently and imaging but unsure what kind. No other focal neurologic complaints. No FHx alzheimers.   Reviewed notes, labs and imaging from outside physicians, which showed: CMP, CBC unremarkable, ldl 81, aic 5.5,    Review of Systems: Patient complains of symptoms per HPI as well as the following symptoms: Endorses Stress, no chest pain, no shortness of breath. Pertinent negatives per HPI. All others negative.    Social History   Social History  . Marital Status: Married    Spouse Name: [name]   . Number of Children: 3  . Years of Education: 12+   Occupational History  . Real Estate-rental properties  Social History Main Topics  . Smoking status: Never Smoker   . Smokeless tobacco: Not on file  . Alcohol Use: 0.0 oz/week    0 Standard drinks or equivalent per week     Comment: Socially   . Drug Use: Not on file  . Sexual Activity: Not  on file   Other Topics Concern  . Not on file   Social History Narrative   Lives at home with wife, Dennie Bible.   Caffeine use: occasional        Family History  Problem Relation Age of Onset  . Heart disease Father   . Stroke Father   . Cancer Mother   . Pulmonary embolism Mother     Past Medical History  Diagnosis Date  . Sleep apnea     NPSG 08-03-07 AHI 10/hr, RDI 19.7/hr  . High cholesterol   . Kidney stone   . Memory loss     Past Surgical History  Procedure Laterality Date  . Tonsillectomy  1942  . Right clavicle repair  1954    fx  . Wisdom tooth extraction  1961    Current Outpatient Prescriptions  Medication Sig Dispense Refill  . pentosan polysulfate (ELMIRON) 100 MG capsule Take 100 mg by mouth as needed. One  times daily    . potassium citrate (UROCIT-K) 10 MEQ (1080 MG) SR tablet Take 2 tablets by mouth daily.  11  . rivastigmine (EXELON) 9.5 mg/24hr Place 1 patch onto the skin daily.  5  . sertraline (ZOLOFT) 50 MG tablet Take 25 mg by mouth Daily.     . simvastatin (ZOCOR) 80 MG tablet Take 80 mg by mouth at bedtime.      . tamsulosin (FLOMAX) 0.4 MG CAPS capsule Take 0.4 mg by mouth daily.  11   No current facility-administered medications for this visit.    Allergies as of 07/30/2015  . (No Known Allergies)    Vitals: BP 135/79 mmHg  Pulse 63  Ht 5\' 9"  (1.753 m)  Wt 202 lb 12.8 oz (91.989 kg)  BMI 29.93 kg/m2 Last Weight:  Wt Readings from Last 1 Encounters:  07/30/15 202 lb 12.8 oz (91.989 kg)   Last Height:   Ht Readings from Last 1 Encounters:  07/30/15 5\' 9"  (1.753 m)    Physical exam: Exam: Gen: NAD, conversant, well nourised, obese, well groomed  CV: RRR, no MRG. No Carotid Bruits. No peripheral edema, warm, nontender Eyes: Conjunctivae clear without exudates or hemorrhage  Neuro: Detailed Neurologic Exam  Speech:  Speech is normal; fluent and spontaneous with normal comprehension.   Montreal  Cognitive Assessment  07/30/2015 01/22/2015  Visuospatial/ Executive (0/5) 4 4  Naming (0/3) 3 3  Attention: Read list of digits (0/2) 2 2  Attention: Read list of letters (0/1) 1 1  Attention: Serial 7 subtraction starting at 100 (0/3) 3 3  Language: Repeat phrase (0/2) 2 1  Language : Fluency (0/1) 1 1  Abstraction (0/2) 2 2  Delayed Recall (0/5) 0 1  Orientation (0/6) 6 5  Total 24 23  Adjusted Score (based on education) 24 23   Cognition: MoCA 23/30 ( -1 visuospatial execution, -1 language, -4 delayed recall, -1 orientation)  The patient is oriented to person, place, and time;   recent memory impaired and remote memory intact;   language fluent;   normal attention, concentration,   fund of knowledge appears grossly intact Cranial Nerves:  The pupils are equal, round, and reactive to light. The fundi  are normal and spontaneous venous pulsations are present. Visual fields are full to finger confrontation. Extraocular movements are intact. Trigeminal sensation is intact and the muscles of mastication are normal. The face is symmetric. The palate elevates in the midline. Hearing intact to voice. Voice is normal. Shoulder shrug is normal. The tongue has normal motion without fasciculations.   Coordination:  Normal finger to nose and heel to shin. Normal rapid alternating movements.   Gait:  Heel-toe and tandem gait are normal.   Motor Observation:  Mild postural tremor Tone:  Normal muscle tone.   Posture:  Posture is normal. normal erect   Strength:  Strength is V/V in the upper and lower limbs.    Sensation: intact to LT   Reflex Exam:  DTR's:  Deep tendon reflexes in the upper and lower extremities are symmetrical bilaterally.  Toes:  The toes are downgoing bilaterally.  Clonus:  Clonus is absent.   Assessment/Plan: 80 y.o. male here as a referral from Dr. Osborne Casco for memory changes. PMHx HLD, BPH., OSA on cpap,  IBS, gerd,anxiety, cystitis. MoCA 23/30. MRI of the brain showed white matter changes that are nonspecific and possible cerebral amyloid angiopathy. Had a long discussion with patient and his wife considering this disorder. Unfortunately spontaneous lobar hemorrhage can be a manifestation of this disorder. There can also be neurologic symptoms in association with this disorder. I suggested avoiding anticoagulants and antiplatelet agents. Control of blood pressure is always advisable.  Neurocognitive testing. Dr Valentina Shaggy preferred if possible.     Sarina Ill, MD  [facility] [address] [place], [place] [address]  Phone [phone] Fax [phone]  A total of 30 minutes was spent face-to-face with this patient. Over half this time was spent on counseling patient on the cerebral amyloid angiopathy diagnosis and different diagnostic and therapeutic options available.

## 2015-08-20 ENCOUNTER — Telehealth: Payer: Self-pay | Admitting: Neurology

## 2015-08-20 NOTE — Telephone Encounter (Signed)
LVM returning call from pt. Gave [facility] phone number

## 2015-08-20 NOTE — Telephone Encounter (Signed)
Patient called to advise he is having reaction to medication, ringing in ears, stomach hurts, tremendous amount of gas, headaches in back of head that comes and goes, [name] had mentioned that there is a lower dose of the rivastigmine (EXELON) 9.5 mg/24hr.

## 2015-08-20 NOTE — Telephone Encounter (Signed)
Pt returned Emma's call.  please call him at [phone]

## 2015-08-20 NOTE — Telephone Encounter (Signed)
Dr Jaynee Eagles- please advise-- Called pt back. Advised Dr Jaynee Eagles out of office today. Stop medication and I will speak to Dr Jaynee Eagles tomorrow when she is back.  I will call him back to advise after I speak with her. He verbalized understanding.

## 2015-08-21 ENCOUNTER — Other Ambulatory Visit: Payer: Self-pay | Admitting: Neurology

## 2015-08-21 MED ORDER — RIVASTIGMINE 4.6 MG/24HR TD PT24: 4.6000 mg | MEDICATED_PATCH | Freq: Every day | TRANSDERMAL | Status: DC

## 2015-08-21 NOTE — Telephone Encounter (Signed)
Called pt back. Relayed Dr Jaynee Eagles message below. He understands and stated he stopped higher dose patch yesterday. Pt still having ringng in ears. This is not a common SE of medication. Per Dr Jaynee Eagles, it may take a couple days to go away. Advised for him to wait a couple days to see if sx improve. Then he can start new dose if it has. He verbalized understanding.

## 2015-08-21 NOTE — Telephone Encounter (Signed)
I called in the 4.6mg  patch. It is a lower dose. Place on skin daily. If he has side effects, stop that as well. Thank!

## 2015-08-28 NOTE — Telephone Encounter (Signed)
Tried calling pt back. Unable to LVM. Mailbox full on work number.

## 2015-08-28 NOTE — Telephone Encounter (Addendum)
Dr Jaynee Eagles- FYI Called and spoke to Dylan [name]. Pt not available. Could not reach on his mobile number. She will let pt know that Dr Jaynee Eagles wants him to be evaluated by PCP and see if there are other causes of ringing in the ears. Does not think it is the exelon patch. He has appt on Friday with hearing aid doctor. Advised for them to let us know what they say. They are going to hold off on starting exelon back until they see hearing aid doctor. Told her to call back if she has further questions. She verbalized understanding.

## 2015-08-28 NOTE — Telephone Encounter (Signed)
Patient called to advise he is still having ringing in ears since discontinuing rivastigmine (EXELON) 4.6 mg/24hr, please call [phone].

## 2015-09-19 ENCOUNTER — Encounter: Payer: Self-pay | Admitting: Psychology

## 2015-09-19 ENCOUNTER — Ambulatory Visit: Payer: PPO | Attending: Psychology | Admitting: Psychology

## 2015-09-19 DIAGNOSIS — G3184 Mild cognitive impairment, so stated: Secondary | ICD-10-CM | POA: Insufficient documentation

## 2015-09-19 NOTE — Progress Notes (Signed)
[facility]  [address]   Telephone [phone] [address] Fax [phone] [place], [place] [address]  Initial Contact Note  Name:  Dylan [name] Date of Birth; 07-15-1935 MRN:  [id number] Date:  09/19/2015  Dylan [name] is an 80 y.o. male who was referred for neuropsychological evaluation by Sarina Ill, MD due to problems with memory.   A total of 5 hours was spent today reviewing medical records, interviewing (CPT [phone]) Dylan [name] and his wife, administering and scoring neurocognitive tests (CPT [phone] & [phone]).  Diagnostic Impression: Mild cognitive impairment, amnestic type, multiple domains  There were no concerns expressed or behaviors displayed by Dylan [name] that would require immediate attention.   A full report will follow once the planned testing has been completed. His next appointment is scheduled for 09/25/15.   Jamey Ripa, Ph.D Licensed Psychologist 09/19/2015

## 2015-09-25 ENCOUNTER — Ambulatory Visit (INDEPENDENT_AMBULATORY_CARE_PROVIDER_SITE_OTHER): Payer: PPO | Admitting: Psychology

## 2015-09-25 DIAGNOSIS — G3184 Mild cognitive impairment, so stated: Secondary | ICD-10-CM | POA: Diagnosis not present

## 2015-09-25 NOTE — Progress Notes (Signed)
[facility]  [address]   Telephone [phone] [address] Fax [phone] [place], [place] [address]   [place]* This report should not be released without the consent of the client  Name:   Dylan [name]. [place]  Date of Birth:  March 09, 2036 [facility] MR#:  [id number] Dates of Evaluation: 09/19/15 & 09/25/15   Reason for Referral Dylan [name] is a 80 year-old, right-handed man who was referred for neuropsychological evaluation by Sarina Ill, MD of [facility]. Dylan [name] has demonstrated memory difficulties over the past two to three years. A brain MRI scan on 01/31/15 showed mild chronic small vessel ischemic disease and multiple punctate cerebral microhemorrhages in the bilateral cerebral hemispheres that suggested possible cerebral amyloid angiopathy. He was started on donepezil in August 2016 and then switched to rivastigimine in February 2017.  Sources of Information Electronic medical records from the [place] were reviewed. Dylan [name] and his wife, [name], were interviewed.   Chief Complaints Dylan [name] acknowledged having cognitive difficulties exemplified by forgetting details of recent conversations, misplacing objects and struggling to find words while speaking. He was not certain when his mental abilities began to decline. He described his daily functioning as intact as he reported no problems performing personal care activities, taking his medications, keeping track of appointments or driving. He did note a few incidences of paying bills late. 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    According to his wife, he has demonstrated a decline in memory over the past three years, especially noticeable over the past six months. She has observed him to be prone to forget  conversations, occasionally get lost while driving and be less able to keep track of their finances.   With regards to his somatic functioning, Dylan [name] reported experiencing occasional mild pain located at the back of his neck, intermittent ringing in his ears and occasional night sweats. He did not report problems with gait, balance, eye-hand coordination, limb strength, hearing (has worn hearing aids for the past six months with positive result), sleep (has been using a CPAP device for the past five years), daytime alertness, swallowing, speech, appetite, smell or taste.   He described his mood as "not good" but could not elaborate. 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 He denied experiencing persisting sad mood, apathy, anhedonia, loss of interests, passive or active suicidal thoughts, social withdrawal, mania, hallucinations and delusions.   His wife stated that he has generally appeared calm and in good spirits though at times has seemed discouraged and worried by his memory difficulties and issues related to his business. She has not observed any changes in his mood, behavior or personality. She described his typical demeanor as "kind" and "agreeable". She has not observed him to have exhibited confusion, wandering, bizarre behavior, loss of social comportment, poor judgment or unsafe behavior.  Background  Dylan [name] lives with his wife of 36 three years. They have three daughters.    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 He had previously worked as a Engineer, building services until about fifteen years ago.   He reported that he attended three years of college at [employer]. He stated he dropped out of college due to getting a job offer and  needing to assist his parents. He reported that he always did well in school but diagnosed himself with dyslexia due to his sense that his reading pace was very slow. He did not cite any history of school-based problems with attention or learning.  His past medical history was notable for hyperlipidemia, obstructive sleep apnea (on CPAP), gastroesophageal reflux disease and interstitial cystitis.   He did not report history of unusual childhood illnesses, developmental delays, head injury, loss of consciousness, seizure activity, stroke-like symptoms, exposure to toxic chemicals or substance abuse.   With regards to family history, he reported that two maternal aunts developed dementia. He reported that his mother suffered a "nervous breakdown" and was institutionalized for several months when he was a child.  He reported no history of serious emotional difficulties or mental health contacts. He has been taking sertraline for about the past five years as prescribed by his primary care physician, by his report to treat interstitial cystitis.    His current medications include pentosan polysulfate, rivastigimine patch, sertraline (since 2014) simvastatin and tamsulosin.  Observations He appeared as an appropriately dressed and groomed man in no apparent distress. He interacted in a pleasant and cooperative manner. His facial expression appeared somewhat lacking in animation; and his wife described his expression as one of detachment. He did not display signs of emotional distress.  His speech was marked by numerous word-finding pauses. No problems were evident for speech articulation, prosody, word selection, message coherence or language comprehension. He was oriented in all spheres. He displayed difficulty recalling when in time past life events had occurred. He tended to quickly defer to his wife to answer most questions. His thought processes were coherent and organized with some mild rambling. There  were no signs of loose associations, verbal perseverations or flight of ideas. His thought content was devoid of unusual or bizarre ideas.  Evaluation Procedures Animal Naming Test  [facility] Naming Test Controlled Oral Word Association Test Geriatric Anxiety Scale Geriatric Depression Scale Modified [place] Card Sorting Test Rey Complex Figure: Copy Trail Making A & B Wechsler Adult Intelligence Scale-IV: Music therapist, Coding, Digit Span & Similarities  Wechsler Memory Scale-IV: Older Adult Battery Wide Range Achievement Test-4: Word Reading  Assessment Results Test Validity & Interpretive Considerations It was concluded that the test results represented a valid measure of his current cognitive functioning. He did not display signs or physical or emotional distress. He did not report or display problems with vision, hearing or motor skills. He complained of feeling fatigued towards the end of the testing session but appeared to maintain adequate alertness. He was able to comprehend task instructions without difficulty He appeared to expend consistent and optimal effort.   His pre-morbid intellectual potential was estimated to fall within the Average range based on his educational background (i.e., three years of college) coupled with a measure of word reading (Wide Range Achievement Test-4), which is usually resistant to the effects of neurological and psychiatric disorder.   His test scores were corrected to reflect norms for his age and, whenever possible, his gender and educational level (i.e., 15 years). A listing of test results can be found at the end of this report.  Speed of Processing His performances on measures of visual processing speed that required him to transcribe symbols to match digits using a key (Wechsler Adult Intelligence Scale-IV (WAIS-IV) Coding) or to draw lines  to connect randomly arrayed numbers in sequence (Trails A) fell within the Low Average to Average range.    Executive Function Assessment of higher cognitive functions revealed weaknesses on several measures of executive function, which comprise attentional, organizational and reasoning processes that enable a person to successfully initiate, monitor, shift, plan and execute complex behavior.  His scores on tests of working memory that required him to mentally rearrange digit sequences in reverse or ascending order (WAIS-IV Digit Span) or immediately recognize symbols in left to right order (Wechsler Memory Scale-IV (WMS-IV) Symbol Span), were mostly within the Borderline range.   His speed to complete a visual sequencing task that required mental tracking speed and set shifting (Trails B) fell within the Low Average range.   His ability to fluently generate words from either a category (Animal Naming Test) or a letter prompt (Controlled Oral Word Association Test) was within the impaired range, with a particular weakness for category fluency.   His ability to express abstract verbal ideas (WAIS-IV Similarities) fell within the Average range.   Nonverbal problem-solving (Modified [employer]) was deficient as he could infer only one way to sort geometric cards despite having received ongoing feedback.  Learning & Memory On the WMS-IV, his immediate memory was intact at the lower end of the Average range at the 27th percentile. His delayed memory, as assessed after an approximate twenty minute delay, declined to the Borderline range at the 6th percentile. His performances on auditory delayed memory subtests were lower than expected relative to his initial level of learning. His scores on a recognition format on which he was given prompts or multiple choices was significantly better than his free delayed recall on two of the three subtests, which indicated that a significant degree of his memory loss was related to difficulties retrieving information from memory storage.  Language His  performance on a measure of confrontational naming [facility] [employer]) was within normal expectations. In contrast, as noted above his performances on measures of letter fluency (Controlled Oral Word Association Test) and semantic fluency (Animal Naming Test) were subnormal. His ability to read words (Wide Range Achievement Test-4: Word Reading) fell within the Average range.  Visual-Spatial Perception & Organization There were no signs of spatial inattention or problems with visual recognition. His score on a test of visuospatial organization that required assembly of two-dimensional block designs from models (WAIS-IV Block Design) fell within the Average range. His copy of a spatially complex geometric figure (Rey Complex Figure) was normal.     Emotional Status His score of 7/30 on the Geriatric Depression Scale was below the cut-off that would suggest depression. He did endorse items that reflected feeling dissatisfied with life, cognitive complaints and lacking energy but did not any symptoms of sadness, hopelessness or worthlessness. His score of 10 on the Geriatric Anxiety Scale fell within the minimal range.   Summary & Conclusions Dylan [name] is a 80 year old man who according to his wife has demonstrated cognitive decline over the past three years, especially so over the past six months. He continues to maintain a relatively independent lifestyle. An MRI of the brain on 01/31/15 showed mild chronic small vessel ischemic disease and multiple punctate cerebral microhemorrhages in the bilateral cerebral hemispheres that suggested possible cerebral amyloid angiopathy.  Observations of this pleasant and socially appropriate man were notable for a somewhat detached facial expression, difficulty finding words while talking, inability to remember time frames and dates of some past life events and a  tendency to defer to his wife when questioned.  Neuropsychological testing indicated mild impairment  of delayed memory (mostly due to difficulty retrieving information from memory storage) as well as for executive skills of working memory, verbal fluency (both phonemic and semantic) and problem-solving dependent on inferential reasoning. In contrast, his speed of processing, confrontational naming, visual-spatial organizational and visual-constructional abilities were intact. He endorsed a minimal degree of affective distress on standardized symptom questionnaires.     In conclusion, his neuropsychological profile considered together with the description of his everyday functioning would be consistent with Mild Cognitive Impairment, multiple domains. It is not clear on the basis of neuropsychological testing whether his cognitive decline might possibly reflect underlying vascular etiology, Alzheimer's pathology or perhaps both.  Diagnostic Impression Mild Cognitive impairment, multiple domains [G31.84]  Recommendations 1. This mostly sedentary man was encouraged to become more physically active. The benefits of exercise as a possible means to enhance his sense of wellbeing and forestall further cognitive decline were discussed. He was also encouraged to maintain his participation in leisure and social activities.  2. He appears to greatly rely on his wife to help him with memory issues. He was encouraged to take a more active role in compensating for his memory loss by using strategies, such as a note taking, a written daily schedule, check-off lists and a centrally located bulletin board. His wife was advised to first offer him a verbal or situational cue before providing him with the answer.  3. 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  4. It is recommended that he undergo a repeat neuropsychological evaluation in one year (or sooner as clinically indicated) using these test results as a baseline to track for interval changes, if any, in his  cognitive functioning.    I have appreciated the opportunity to evaluate Dylan [name]. The conclusions and recommendations from this evaluation were discussed with him and his wife on 09/24/15. Please feel free to contact me with any comments or questions.    ______________________ Jamey Ripa, Ph.D Licensed Psychologist    ADDENDUM-NEUROPSYCHOLOGICAL TEST RESULTS Animal Naming Test Score= 8 1st (adjusted for age, gender and educational level)   [place] Naming Test Score= [phone] 82nd (adjusted for age, gender and educational level)   Controlled Oral Word Association Test Score= 20 words/1 repetition 5th (adjusted for age, gender and educational level)   Modified [place] Card Sorting Test  Categories correct       1 <1st  (adjusted for age and education)  Perseverative errors      3 42nd              Total errors      2 16th         Executive function composite     73   4th         Rey Complex Figure: copy  Score= 34/36 normal     Trails A Score= 69  1e    18th (adjusted for age, gender and educational level)  Trails B Score=139s  0e      21st (adjusted for age, gender and educational level)   Wechsler Adult Intelligence Scale-IV   Subtest Scaled Score Percentile  Block Design    9   37th      Similarities    8   25th       Digit Span   Forward   Backward   Sequencing    6  7    8    5     9th       16th     25th        5th       Coding    10   75th       Wechsler Memory Scale-IV Older Adult Battery Index Index Score Percentile  Immediate Memory  91 27th       Auditory Memory  80   9th      Visual Memory  98 45th       Delayed Memory  77   6th        Symbol Span subtest Scaled score= 5   5th         Wide Range Achievement Test-4 Subtest  Raw score Standard score Percentile  Word Reading 59/70 104 61st

## 2015-10-10 ENCOUNTER — Encounter: Payer: Self-pay | Admitting: Neurology

## 2015-10-10 ENCOUNTER — Ambulatory Visit (INDEPENDENT_AMBULATORY_CARE_PROVIDER_SITE_OTHER): Payer: PPO | Admitting: Neurology

## 2015-10-10 ENCOUNTER — Telehealth: Payer: Self-pay | Admitting: *Deleted

## 2015-10-10 VITALS — BP 129/79 | HR 74 | Ht 69.0 in | Wt 202.0 lb

## 2015-10-10 DIAGNOSIS — G3184 Mild cognitive impairment, so stated: Secondary | ICD-10-CM | POA: Diagnosis not present

## 2015-10-10 DIAGNOSIS — E559 Vitamin D deficiency, unspecified: Secondary | ICD-10-CM

## 2015-10-10 NOTE — Telephone Encounter (Signed)
Pt called, need name of dr at [facility]. Please call [phone].

## 2015-10-10 NOTE — Telephone Encounter (Signed)
I'm sorry I don;t know any particular doctors at [place]. He just needs referral for the "[facility]" thanks

## 2015-10-10 NOTE — Progress Notes (Signed)
[facility]    Provider:  Dr Jaynee Eagles Referring Provider: [name], MD Primary Care Physician:  [name], MD  CC: Memory changes  Interval History 10/10/2015: Had a long discussion regarding his recent diagnosis of MCI of possibly AD or Vascular etiology. Wife is here, feels he is sleeping more during the day in front of TV, not exercising and needs to de-stress his life. Advised him to exercise, and manage stress. He says he has a lot going on. He has been more stressed this year. 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 He is compliant with his cpap. He is compliant with his Exelon.Discussed mood and if he feels depressed we can treat this. Wife would like a referral to a specialty memory clinic and I am very supportive of that. I encourage them to discuss with Dr. Osborne Casco for referral to [facility] or [facility] memory clinic. Discussed the difference between Mild and major cognitive impairment and dementia.  Interval history 07/30/2015: 80 year old highly functional male with memory changes. 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 No behavioral issues. No hallucinations, delusions. No shuffling, no parkinsonian symptoms, no tremor. Memory is a little worse per wife. His stress level has been very high. The last month.At this time she saw more "slipping: because he had so much on his mind. He couldn't have a conversation that was complete. Had diarrhea with Aricept and was placed on a patch last week. Rivastigmine. He has OSA and is compliant with his mask Will refer for neurocognitive testing. He is having headaches and ringing on the ears   Interval History 02/11/2015: Patient returns today to discuss MRI of the brain. MRI of the brain showed nonspecific white matter changes as well as possible cerebral amyloid angiopathy. Small subclinical leaks microhemorrhages are seen in cerebral amyloid angiopathy as was seen  in the MRI of his brain. Discussion with patient and his wife about cerebral amyloid angiopathy, amyloid beta peptide protein deposits in the smaller sized blood vessels of the brain. The biggest risk factor for this disorder his age. There is a risk for intracerebral hemorrhage with this condition. Unfortunately spontaneous lobar hemorrhage can be a manifestation of this disorder. There can also be neurologic symptoms in association with this disorder. I suggested avoiding anticoagulants and antiplatelet agents. Control of blood pressure is always advisable. Patient and his wife would like to see a stroke specialist to discuss this further. I recommended them to my colleague Dr. Erlinda Hong.    MRI brain 01/31/2015:  Abnormal MRI brain (without) demonstrating: 1. Mild chronic small vessel ischemic disease.  2. At least 7 punctate cerebral microhemorrhages noted in the bilateral cerebral hemispheres. This could be related to underlying amyloid-based pathology vs chronic small vessel ischemic disease spectrum. 3. No acute findings.  HPI: Dylan [name] is a 80 y.o. male here as a referral from Dr. Osborne Casco for memory changes. PMHx HLD, BPH., OSA on cpap, IBS, gerd,anxiety, cystitis Wifre provides most information. Most noticeable driving, he misses turns to places he has gone many times. The first time they noticed it was 2 years ago. It was night time. Progressively getting worse. He notices it as well. He forgets conversations, forgets names of people he just met. They live independently, pays bills, he remembers major things. Keeping track of appointments. 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 No behavioral issues. No hallucinations, delusions. No shuffling, no  parkinsonian symptoms, no tremor. He is less active, his work is his hobbies and he still enjoys running his company. Had lab tests recently and imaging but unsure what kind. No other focal neurologic  complaints. No FHx alzheimers.   Reviewed notes, labs and imaging from outside physicians, which showed: CMP, CBC unremarkable, ldl 81, aic 5.5,    Review of Systems: Patient complains of symptoms per HPI as well as the following symptoms: Endorses Stress, no chest pain, no shortness of breath. Pertinent negatives per HPI. All others negative.  Social History   Social History  . Marital Status: Married    Spouse Name: Dylan [name]   . Number of Children: 3  . Years of Education: 12+   Occupational History  . Real Estate-rental properties    Social History Main Topics  . Smoking status: Never Smoker   . Smokeless tobacco: Not on file  . Alcohol Use: 0.0 oz/week    0 Standard drinks or equivalent per week     Comment: Socially   . Drug Use: Not on file  . Sexual Activity: Not on file   Other Topics Concern  . Not on file   Social History Narrative   Lives at home with wife, Dylan [name].   Caffeine use: occasional        Family History  Problem Relation Age of Onset  . Heart disease Father   . Stroke Father   . Cancer Mother   . Pulmonary embolism Mother     Past Medical History  Diagnosis Date  . Sleep apnea     NPSG 08-03-07 AHI 10/hr, RDI 19.7/hr  . High cholesterol   . Kidney stone   . Memory loss     Past Surgical History  Procedure Laterality Date  . Tonsillectomy  1942  . Right clavicle repair  1954    fx  . Wisdom tooth extraction  1961    Current Outpatient Prescriptions  Medication Sig Dispense Refill  . pentosan polysulfate (ELMIRON) 100 MG capsule Take 100 mg by mouth as needed. One  times daily    . potassium citrate (UROCIT-K) 10 MEQ (1080 MG) SR tablet Take 2 tablets by mouth daily.  11  . rivastigmine (EXELON) 4.6 mg/24hr Place 1 patch (4.6 mg total) onto the skin daily. 30 patch 12  . sertraline (ZOLOFT) 50 MG tablet Take 25 mg by mouth Daily.     . simvastatin (ZOCOR) 80 MG tablet Take 80 mg by mouth at bedtime.      . tamsulosin (FLOMAX) 0.4 MG CAPS  capsule Take 0.4 mg by mouth daily.  11   No current facility-administered medications for this visit.    Allergies as of 10/10/2015  . (No Known Allergies)    Vitals: BP 129/79 mmHg  Pulse 74  Ht _0  (1.753 m)  Wt 202 lb (91.627 kg)  BMI 29.82 kg/m2 Last Weight:  Wt Readings from Last 1 Encounters:  10/10/15 202 lb (91.627 kg)   Last Height:   Ht Readings from Last 1 Encounters:  10/10/15 _1  (1.753 m)     Cranial Nerves:  The pupils are equal, round, and reactive to light. The fundi are normal and spontaneous venous pulsations are present. Visual fields are full to finger confrontation. Extraocular movements are intact. Trigeminal sensation is intact and the muscles of mastication are normal. The face is symmetric. The palate elevates in the midline. Hearing intact to voice. Voice is normal. Shoulder shrug is normal. The tongue has normal  motion without fasciculations.   Coordination:  Normal finger to nose and heel to shin. Normal rapid alternating movements.   Gait:  Heel-toe and tandem gait are normal.   Motor Observation:  Mild postural tremor Tone:  Normal muscle tone.   Posture:  Posture is normal. normal erect   Strength:  Strength is V/V in the upper and lower limbs.    Sensation: intact to LT   Reflex Exam:  DTR's:  Deep tendon reflexes in the upper and lower extremities are symmetrical bilaterally.  Toes:  The toes are downgoing bilaterally.  Clonus:  Clonus is absent.   Assessment/Plan: 80 y.o. male here as a referral from Dr. Osborne Casco for memory changes. PMHx HLD, BPH., OSA on cpap, IBS, gerd,anxiety, cystitis. MoCA 23/30. MRI of the brain showed white matter changes that are nonspecific and possible cerebral amyloid angiopathy.  recent diagnosis of MCI of possibly AD or Vascular etiology. Had a long discussion with patient and his wife of Mild Cognitive impairment. Discussed our research opportunities,  they would prefer to be seen at [facility]. I suggested avoiding anticoagulants and antiplatelet agents given his cerebral amyloid angiopathy. Control of blood pressure is always advisable. Continue exelon patch. See HPI for urthr discussion of MCI, dementia.  We'll check vitamin D levels today given recent literature showing association between low vitamin D levels and memory loss. If low, we'll consider 2000 international units vitamin D3 daily.   Sarina Ill, MD  [facility] [address] [place], [place] [address]  Phone [phone] Fax [phone]  A total of 45 minutes was spent face-to-face with this patient. Over half this time was spent on counseling patient on the Mild cognitive impairment diagnosis and different diagnostic and therapeutic options available.

## 2015-10-10 NOTE — Telephone Encounter (Signed)
Dr Jaynee Eagles- please advise. I did not see anything in your note

## 2015-10-10 NOTE — Patient Instructions (Signed)
Remember to drink plenty of fluid, eat healthy meals and do not skip any meals. Try to eat protein with a every meal and eat a healthy snack such as fruit or nuts in between meals. Try to keep a regular sleep-wake schedule and try to exercise daily, particularly in the form of walking, 20-30 minutes a day, if you can.   As far as your medications are concerned, I would like to suggest: Continue Exelon  I would like to see you back in 6-12 months, sooner if we need to. Please call us with any interim questions, concerns, problems, updates or refill requests.   Our phone number is [phone]. We also have an after hours call service for urgent matters and there is a physician on-call for urgent questions. For any emergencies you know to call 911 or go to the nearest emergency room

## 2015-10-10 NOTE — Telephone Encounter (Signed)
Called and relayed Dr Jaynee Eagles message below to pt. He verbalized understanding.

## 2015-10-11 DIAGNOSIS — G3184 Mild cognitive impairment, so stated: Secondary | ICD-10-CM | POA: Insufficient documentation

## 2015-10-11 LAB — VITAMIN D PNL(25-HYDRXY+1,25-DIHY)-BLD
VIT D 25 HYDROXY: 14.5 ng/mL — AB (ref 30.0–100.0)
Vit D, 1,25-Dihydroxy: 42.6 pg/mL (ref 19.9–79.3)

## 2015-10-14 ENCOUNTER — Telehealth: Payer: Self-pay

## 2015-10-14 NOTE — Telephone Encounter (Signed)
I spoke to wife and she is aware of results and will relay to patient.  Wife is asking about referral to [facility] for memory? She is asking if [name] has made that referral yet? I advised her that [name] and Terrence Dupont are out today but will return call.

## 2015-10-14 NOTE — Telephone Encounter (Signed)
LVM for pt. Advised he will need to contact PCP to get referral to [facility], which Dr Jaynee Eagles discussed with him at last appt. Gave [facility] phone number if he has further questions.

## 2015-10-14 NOTE — Telephone Encounter (Signed)
-----   Message from Melvenia Beam, MD sent at 10/11/2015  5:18 PM EDT ----- Vitamin d looks ok thanks

## 2015-10-14 NOTE — Telephone Encounter (Signed)
Per our office policy, their primary care needs to make referral to [facility]. thanks

## 2015-10-14 NOTE — Telephone Encounter (Signed)
Dr Jaynee Eagles- are you going to place referral to [facility]? I did not see anything in the chart. Please advise

## 2015-10-18 ENCOUNTER — Telehealth: Payer: Self-pay | Admitting: Neurology

## 2015-10-18 NOTE — Telephone Encounter (Signed)
Patient requesting refill of : rivastigmine (EXELON) 4.6 mg/24hr [id number] [place], [place]  Pt has some questions pertaining to the medication as well.

## 2015-10-18 NOTE — Telephone Encounter (Signed)
LM for patient to call back if any questions. I advised that [facility] [address]. Left call back number for further questions.

## 2015-11-04 ENCOUNTER — Other Ambulatory Visit: Payer: Self-pay | Admitting: Neurology

## 2015-11-04 ENCOUNTER — Telehealth: Payer: Self-pay | Admitting: Neurology

## 2015-11-04 MED ORDER — RIVASTIGMINE 9.5 MG/24HR TD PT24: 9.5000 mg | MEDICATED_PATCH | Freq: Every day | TRANSDERMAL | Status: DC

## 2015-11-04 NOTE — Telephone Encounter (Signed)
Pt called in about rivastigmine (EXELON) 4.6 mg/24hr. He wants to know if he should stay at this dose or switch to a higher dose. Please call [phone]

## 2015-11-04 NOTE — Telephone Encounter (Signed)
As long as he is not experiencing any side effects I will call in an increase thanks!

## 2015-11-04 NOTE — Telephone Encounter (Signed)
Tried calling pt back on work/mobile number. Mailbox full, unable to LVM. Tried calling home number. Spoke to wife. She stated pt not available and she told me to call his number. I advised I already tried this and could not LVM. She stated for me to leave message with her. She states he is using 2 of the 4.6mg /24hr patches and doing well with this. Per Dr Jaynee Eagles, ok for pt to take medication like this. Wife states he was wondering if he should continue using 2 patches or use one patch (9.5mg Margo Aye). Previously, the 9.5mg /24hr patch upset his stomach. Advised Dr Jaynee Eagles okay with what pt chooses, but she called in 9.5mg /hr patch to pharmacy today. Told her to have him call us back and let us know which one he wants to do. Gave [facility] hours. She verbalized understanding.   Has appt at [facility] 06/06/15 with Dr Lavone Neri.

## 2015-11-04 NOTE — Telephone Encounter (Signed)
Dr Ahern- please advise 

## 2015-11-05 NOTE — Telephone Encounter (Signed)
Called and spoke to pt since I had not heard from him. He stated he is going to try the 9.5mg /24hr patch that Dr Jaynee Eagles called in. He will call back if he experiences any SE.

## 2015-11-07 ENCOUNTER — Ambulatory Visit: Payer: PPO | Admitting: Internal Medicine

## 2015-11-11 ENCOUNTER — Encounter (INDEPENDENT_AMBULATORY_CARE_PROVIDER_SITE_OTHER): Payer: Self-pay

## 2015-11-11 ENCOUNTER — Ambulatory Visit (INDEPENDENT_AMBULATORY_CARE_PROVIDER_SITE_OTHER): Payer: PPO | Admitting: Internal Medicine

## 2015-11-11 ENCOUNTER — Encounter: Payer: Self-pay | Admitting: Internal Medicine

## 2015-11-11 VITALS — BP 116/70 | HR 90 | Ht 69.0 in | Wt 201.8 lb

## 2015-11-11 DIAGNOSIS — G4733 Obstructive sleep apnea (adult) (pediatric): Secondary | ICD-10-CM | POA: Diagnosis not present

## 2015-11-11 DIAGNOSIS — G2581 Restless legs syndrome: Secondary | ICD-10-CM | POA: Diagnosis not present

## 2015-11-11 NOTE — Patient Instructions (Signed)
Order- DME [facility]- ok to continue CPAP 7, mask of choice, humidifier, supplies. Please add AirView or provide download    Dx OSA  Please call as needed

## 2015-11-11 NOTE — Progress Notes (Signed)
  Subjective:    Patient ID: Dylan [name], male    DOB: 05/15/1936, 80 y.o.   MRN: [id number]  HPI M Never smoker followed for OSA, Restless Legs, hx allergy.   11/08/14- 52 yoM followed for OSA, Restless Legs, hx allergic rhinitis, complicated by GERD CPAP 7/ [facility] Follows For: yearly f/u. Pt uses CPAP 8-9 hours nightly, maks fits well.  He tried an oral appliance but it caused excessive salivation. He and Dr. Ron Parker are still trying to make it work.  11/11/2015-80 year old male never smoker followed for OSA, restless legs, history allergic rhinitis, complicated by GERD CPAP 7/[facility] FOLLOWS FOR:DME:[facility]; wears CPAP every night for about 8-9 hours. Pressure working well for patient. No DL today. Will need to order for DL and enroll in [place]. "Not satisfied" with full face mask. We discussed options. He feels pressure is comfortable and he describes excellent compliance, documented on download at last year's visit. Failed oral appliance trial.  ROS-see HPI Constitutional:   No-   weight loss, night sweats, fevers, chills, fatigue, lassitude. HEENT:   No-  headaches, difficulty swallowing, tooth/dental problems, sore throat,       No-  sneezing, itching, ear ache, nasal congestion, post nasal drip,  CV:  No-   chest pain, orthopnea, PND, swelling in lower extremities, anasarca, dizziness, palpitations Resp: No-   shortness of breath with exertion or at rest.              No-   productive cough,  No non-productive cough,  No- coughing up of blood.              No-   change in color of mucus.  No- wheezing.   Skin: No-   rash or lesions. GI:  +  heartburn, no-indigestion, abdominal pain, nausea, vomiting,  GU:  MS:  No-   joint pain or swelling.  Neuro-     nothing unusual Psych:  No- change in mood or affect. No depression or anxiety.  No memory loss.  OBJ- Physical Exam General- Alert, Oriented, Affect-appropriate, Distress- none acute, + overweight,  Skin- rash-none, lesions-  none, excoriation- none Lymphadenopathy- none Head- atraumatic            Eyes- Gross vision intact, PERRLA, conjunctivae and secretions clear            Ears- Hearing, canals-normal            Nose- Clear, no-Septal dev, mucus, polyps, erosion, perforation             Throat- Mallampati II-III , mucosa-normal, drainage- none, tonsils- atrophic Neck- flexible , trachea midline, no stridor , thyroid nl, carotid no bruit Chest - symmetrical excursion , unlabored           Heart/CV- RRR , no murmur , no gallop  , no rub, nl s1 s2                           - JVD- none , edema- none, stasis changes- none, varices- none           Lung- clear to P&A, wheeze- none, cough- none , dullness-none, rub- none           Chest wall-  Abd-  Br/ Gen/ Rectal- Not done, not indicated Extrem- cyanosis- none, clubbing, none, atrophy- none, strength- nl Neuro- grossly intact to observation, calm

## 2015-11-15 NOTE — Telephone Encounter (Signed)
Pt called said he is having HA's, chest tightness, ears ringing since increasing the dose to 9.5mg  on 11/06/15. He said these symptoms come and go. Pt said this happened before when he tried to increase the dose. Please call

## 2015-11-15 NOTE — Telephone Encounter (Signed)
Returned pt TC. Said that he was last on 2 of the 4.6 mg patches per day and felt that he tolerated them well. He will go back to the slightly lower dosage and call back next week w/ update and to request refills, if needed.

## 2015-11-15 NOTE — Telephone Encounter (Signed)
Tell patient to decrease the dose to the last tolerable dose which I think was 4.6 thanks.

## 2015-11-20 DIAGNOSIS — N401 Enlarged prostate with lower urinary tract symptoms: Secondary | ICD-10-CM | POA: Diagnosis not present

## 2015-11-20 DIAGNOSIS — H9190 Unspecified hearing loss, unspecified ear: Secondary | ICD-10-CM | POA: Diagnosis not present

## 2015-11-20 DIAGNOSIS — R7302 Impaired glucose tolerance (oral): Secondary | ICD-10-CM | POA: Diagnosis not present

## 2015-11-20 DIAGNOSIS — F419 Anxiety disorder, unspecified: Secondary | ICD-10-CM | POA: Diagnosis not present

## 2015-11-20 DIAGNOSIS — E7800 Pure hypercholesterolemia, unspecified: Secondary | ICD-10-CM | POA: Diagnosis not present

## 2015-11-20 DIAGNOSIS — F0390 Unspecified dementia without behavioral disturbance: Secondary | ICD-10-CM | POA: Diagnosis not present

## 2015-11-20 DIAGNOSIS — D692 Other nonthrombocytopenic purpura: Secondary | ICD-10-CM | POA: Diagnosis not present

## 2015-11-20 DIAGNOSIS — G4733 Obstructive sleep apnea (adult) (pediatric): Secondary | ICD-10-CM | POA: Diagnosis not present

## 2015-11-20 DIAGNOSIS — Z6829 Body mass index (BMI) 29.0-29.9, adult: Secondary | ICD-10-CM | POA: Diagnosis not present

## 2015-11-28 DIAGNOSIS — R102 Pelvic and perineal pain: Secondary | ICD-10-CM | POA: Diagnosis not present

## 2015-12-10 DIAGNOSIS — R278 Other lack of coordination: Secondary | ICD-10-CM | POA: Diagnosis not present

## 2015-12-10 DIAGNOSIS — M62838 Other muscle spasm: Secondary | ICD-10-CM | POA: Diagnosis not present

## 2015-12-10 DIAGNOSIS — N3010 Interstitial cystitis (chronic) without hematuria: Secondary | ICD-10-CM | POA: Diagnosis not present

## 2015-12-10 DIAGNOSIS — M6281 Muscle weakness (generalized): Secondary | ICD-10-CM | POA: Diagnosis not present

## 2015-12-17 DIAGNOSIS — M6281 Muscle weakness (generalized): Secondary | ICD-10-CM | POA: Diagnosis not present

## 2015-12-17 DIAGNOSIS — R102 Pelvic and perineal pain: Secondary | ICD-10-CM | POA: Diagnosis not present

## 2015-12-17 DIAGNOSIS — R278 Other lack of coordination: Secondary | ICD-10-CM | POA: Diagnosis not present

## 2015-12-17 DIAGNOSIS — M62838 Other muscle spasm: Secondary | ICD-10-CM | POA: Diagnosis not present

## 2015-12-25 DIAGNOSIS — M6281 Muscle weakness (generalized): Secondary | ICD-10-CM | POA: Diagnosis not present

## 2015-12-25 DIAGNOSIS — R278 Other lack of coordination: Secondary | ICD-10-CM | POA: Diagnosis not present

## 2015-12-25 DIAGNOSIS — M62838 Other muscle spasm: Secondary | ICD-10-CM | POA: Diagnosis not present

## 2015-12-25 DIAGNOSIS — R102 Pelvic and perineal pain: Secondary | ICD-10-CM | POA: Diagnosis not present

## 2015-12-31 DIAGNOSIS — R102 Pelvic and perineal pain: Secondary | ICD-10-CM | POA: Diagnosis not present

## 2015-12-31 DIAGNOSIS — R278 Other lack of coordination: Secondary | ICD-10-CM | POA: Diagnosis not present

## 2015-12-31 DIAGNOSIS — M6281 Muscle weakness (generalized): Secondary | ICD-10-CM | POA: Diagnosis not present

## 2015-12-31 DIAGNOSIS — M62838 Other muscle spasm: Secondary | ICD-10-CM | POA: Diagnosis not present

## 2016-01-16 DIAGNOSIS — H43813 Vitreous degeneration, bilateral: Secondary | ICD-10-CM | POA: Diagnosis not present

## 2016-01-16 DIAGNOSIS — H33321 Round hole, right eye: Secondary | ICD-10-CM | POA: Diagnosis not present

## 2016-01-16 DIAGNOSIS — H53149 Visual discomfort, unspecified: Secondary | ICD-10-CM | POA: Diagnosis not present

## 2016-01-16 DIAGNOSIS — H31009 Unspecified chorioretinal scars, unspecified eye: Secondary | ICD-10-CM | POA: Diagnosis not present

## 2016-01-24 ENCOUNTER — Telehealth: Payer: Self-pay | Admitting: Neurology

## 2016-01-24 NOTE — Telephone Encounter (Signed)
There is no problem with him taking vitamin b complex, I think that would be fine. I checked hi b12 and it was normal but no harm in taking more b12. Be careful with taking high doses of b6 however, I don;t recommend more than 50mg  a day maximum thanks

## 2016-01-24 NOTE — Telephone Encounter (Signed)
Patient called requesting to speak with [name] regarding a medication that another Doctor told him about (Vitamin B Complex, ProBotic) for memory loss.

## 2016-01-24 NOTE — Telephone Encounter (Signed)
Spoke to pt and relayed the information re: recommendation on B12 ok , B6 50mg  daily max.   He verbalized understanding.

## 2016-01-24 NOTE — Telephone Encounter (Signed)
Spoke to pt and he wanted to know about taking supplements for memory loss.  (vitamin b complex, natures made probiotic (this is for GI issues) help with absorption?   Takes exelon patch now.  Please advise.

## 2016-02-10 DIAGNOSIS — G4733 Obstructive sleep apnea (adult) (pediatric): Secondary | ICD-10-CM | POA: Diagnosis not present

## 2016-02-18 DIAGNOSIS — L821 Other seborrheic keratosis: Secondary | ICD-10-CM | POA: Diagnosis not present

## 2016-02-18 DIAGNOSIS — L570 Actinic keratosis: Secondary | ICD-10-CM | POA: Diagnosis not present

## 2016-02-18 DIAGNOSIS — D1801 Hemangioma of skin and subcutaneous tissue: Secondary | ICD-10-CM | POA: Diagnosis not present

## 2016-02-18 DIAGNOSIS — L820 Inflamed seborrheic keratosis: Secondary | ICD-10-CM | POA: Diagnosis not present

## 2016-02-19 DIAGNOSIS — H31009 Unspecified chorioretinal scars, unspecified eye: Secondary | ICD-10-CM | POA: Diagnosis not present

## 2016-02-19 DIAGNOSIS — H43813 Vitreous degeneration, bilateral: Secondary | ICD-10-CM | POA: Diagnosis not present

## 2016-02-19 DIAGNOSIS — H2513 Age-related nuclear cataract, bilateral: Secondary | ICD-10-CM | POA: Diagnosis not present

## 2016-02-22 DIAGNOSIS — N200 Calculus of kidney: Secondary | ICD-10-CM | POA: Diagnosis not present

## 2016-02-26 ENCOUNTER — Telehealth: Payer: Self-pay | Admitting: Neurology

## 2016-02-26 NOTE — Telephone Encounter (Signed)
Called patient. He is unsure if lower dose was better. He thinks he did not have the ringing in his ears on the lower dose. He is going to contact his wife to make sure and call me back to let me know. Advised him that we do close at 5pm. He verbalized understanding and took my name.

## 2016-02-26 NOTE — Telephone Encounter (Signed)
Dr Ahern- please advise 

## 2016-02-26 NOTE — Telephone Encounter (Signed)
Was he ok on the lower dose? We can go back to the lower dose if he was fine on it (4.6 I think) let me know thanks

## 2016-02-26 NOTE — Telephone Encounter (Signed)
Pt called sts he is having ringing in the ears and he thinks it is due to rivastigmine (EXELON) 9.5 mg/24hr as it has happened before when he was taking the pill form. He called the manufacturer Calv[employer] [id number] and gave the side effects he is having. He was told the clinic must call, they will not talk with him. He said his "code is [id number], they were having a problem with the equipment" and had to set him up with a code.

## 2016-02-27 NOTE — Telephone Encounter (Signed)
Pt called back to advise he wants to lower the dose as discussed.[facility] [address]

## 2016-02-27 NOTE — Telephone Encounter (Signed)
Dr Jaynee Eagles- see below. Can you call in rx? Thank you

## 2016-02-28 ENCOUNTER — Other Ambulatory Visit: Payer: Self-pay | Admitting: Neurology

## 2016-02-28 MED ORDER — RIVASTIGMINE 4.6 MG/24HR TD PT24: 4.6000 mg | MEDICATED_PATCH | Freq: Every day | TRANSDERMAL | 12 refills | Status: DC

## 2016-02-28 NOTE — Telephone Encounter (Signed)
It has been called in thanls

## 2016-02-28 NOTE — Telephone Encounter (Signed)
Patient called to check status of refill and lower dose of rivastigmine (EXELON) 9.5 mg/24hr

## 2016-02-28 NOTE — Telephone Encounter (Signed)
Relayed to pt. He verbalized appreciation.

## 2016-03-17 ENCOUNTER — Ambulatory Visit (INDEPENDENT_AMBULATORY_CARE_PROVIDER_SITE_OTHER): Payer: PPO | Admitting: Neurology

## 2016-03-17 ENCOUNTER — Encounter: Payer: Self-pay | Admitting: Neurology

## 2016-03-17 VITALS — BP 123/77 | HR 65 | Wt 207.2 lb

## 2016-03-17 DIAGNOSIS — I680 Cerebral amyloid angiopathy: Secondary | ICD-10-CM

## 2016-03-17 DIAGNOSIS — G3184 Mild cognitive impairment, so stated: Secondary | ICD-10-CM

## 2016-03-17 DIAGNOSIS — E854 Organ-limited amyloidosis: Secondary | ICD-10-CM | POA: Diagnosis not present

## 2016-03-17 DIAGNOSIS — E538 Deficiency of other specified B group vitamins: Secondary | ICD-10-CM | POA: Diagnosis not present

## 2016-03-17 NOTE — Patient Instructions (Addendum)
-   your still has condition called mild cognitive impairment  - will continue to monitor over time - you have appointment next month with Megan who can check memory test - recommend to see neuropsych Dr. Bonita Quin in [facility] next year for repeat neuropsych testing.  - will also repeat MRI brain to compare with imaging one year ago.  - Follow up with your primary care physician for stroke risk factor modification. Recommend maintain blood pressure goal <130/80, diabetes with hemoglobin A1c goal below 7.0% and lipids with LDL cholesterol goal below 70 mg/dL.  - recommend eye drops for dry eyes.  - follow up with Jinny Blossom next month and also Megan/[name].

## 2016-03-18 NOTE — Progress Notes (Signed)
STROKE NEUROLOGY FOLLOW UP NOTE  NAME: Dylan [name] DOB: 06/10/1935  REASON FOR VISIT: stroke follow up HISTORY FROM: pt and chart  Today we had the pleasure of seeing Dylan [name] in follow-up at our Neurology Clinic. Pt was accompanied by wife.   History Summary Initial visit 01/22/15 (AA) - JEFFREY GRAEFE is a 80 y.o. male here as a referral from Dr. Osborne Casco for memory changes. PMHx HLD, BPH., OSA on cpap, IBS, gerd,anxiety, cystitis Wifre provides most information. Most noticeable driving, he misses turns to places he has gone many times. The first time they noticed it was 2 years ago. It was night time. Progressively getting worse. He notices it as well. He forgets conversations, forgets names of people he just met. They live independently, pays bills, he remembers major things. Keeping track of appointments. 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 No behavioral issues. No hallucinations, delusions. No shuffling, no parkinsonian symptoms, no tremor. He is less active, his work is his hobbies and he still enjoys running his company. Had lab tests recently and imaging but unsure what kind. No other focal neurologic complaints. No FHx alzheimers.  Follow up 02/11/15 (AA) - Patient returns today to discuss MRI of the brain. MRI of the brain showed nonspecific white matter changes as well as possible cerebral amyloid angiopathy. Small subclinical leaks microhemorrhages are seen in cerebral amyloid angiopathy as was seen in the MRI of his brain. Discussion with patient and his wife about cerebral amyloid angiopathy, amyloid beta peptide protein deposits in the smaller sized blood vessels of the brain. The biggest risk factor for this disorder his age. There is a risk for intracerebral hemorrhage with this condition. Unfortunately spontaneous lobar hemorrhage can be a manifestation of this disorder. There can also be neurologic symptoms in association  with this disorder. I suggested avoiding anticoagulants and antiplatelet agents. Control of blood pressure is always advisable. Patient and his wife would like to see a stroke specialist to discuss this further. I recommended them to my colleague Dr. Erlinda Hong.   03/07/15 follow up - the patient has been doing the same. He had MRI brain on 01/31/15 showed 7 punctate cerebral microhemorrhages noted in the bilateral cerebral hemispheres. As reported "this could be related to underlying amyloid-based pathology vs. Chronic small vessel ischemic disease spectrum". He also had a CTA head and neck which was unremarkable. He denies any ischemic or hemorrhagic stroke in the past. Currently on Aricept, just increased from 5 mg to 10 mg daily. No side effects. He is on aspirin 81 mg every other day and Zocor for stroke prevention. His symptoms very anxious about amyloid angiopathy diagnosis and worry about bleeding risk and memory loss. Blood pressure today 133/79. He has no history of hypertension and is not on any blood pressure medication.  07/30/2015 follow up (AA): 80 year old highly functional male with memory changes.  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 No behavioral issues. No hallucinations, delusions. No shuffling, no parkinsonian symptoms, no tremor. Memory is a little worse per wife. His stress level has been very high. The last month.At this time she saw more "slipping: because he had so much on his mind. He couldn't have a conversation that was complete. Had diarrhea with Aricept and was placed on a patch last week. Rivastigmine.  He has OSA and is compliant with his mask  Will refer for neurocognitive testing.  He is having headaches and ringing on the ears.  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 Wife is here, feels he is sleeping more during the day in front of TV, not exercising and  needs to de-stress his life. Advised him to exercise, and manage stress. He says he has a lot going on. He has been more stressed this year. 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 He is compliant with his cpap. He is compliant with his Exelon.Discussed mood and if he feels depressed we can treat this. Wife would like a referral to a specialty memory clinic and I am very supportive of that. I encourage them to discuss with Dr. Osborne Casco for referral to [facility] or [facility] memory clinic. Discussed the difference between Mild and major cognitive impairment and dementia.   Interval History During the interval time, he has been followed with [name]. I am not sure why pt has appointment with me today. However, he will has appointment with Jinny Blossom next month and also with [facility] for memory deficit in Jan 2018. Anyway, he has been stable without significant change. On exelon patch now and will need to repeat MRI to monitoring microbleeds. BP 123/77.   REVIEW OF SYSTEMS: Full 14 system review of systems performed and notable only for those listed below and in HPI above, all others are negative:  Constitutional:  Fatigue, excessive sweating Cardiovascular:  Ear/Nose/Throat:  Ringing in ears Skin: Moles Eyes:  Eye discharge, eye itching, eye pain Respiratory:  Chest tightness Gastroitestinal:  Rectal pain, incontinence of bowels Genitourinary: Painful urination, testicular pain Hematology/Lymphatic:   Endocrine:  Musculoskeletal:   Allergy/Immunology:   Neurological:  Memory loss, numbness, weakness Psychiatric: Decreased concentration, confusion Sleep: Snoring  The following represents the patient's updated allergies and side effects list: No Known Allergies  The neurologically relevant items on the patient's problem list were reviewed on today's visit.  Neurologic Examination  A problem focused neurological exam (12 or more points of the single system neurologic examination,  vital signs counts as 1 point, cranial nerves count for 8 points) was performed.  Blood pressure 123/77, pulse 65, weight 207 lb 3.2 oz (94 kg).  General - Well nourished, well developed, in no apparent distress.  Ophthalmologic - Sharp disc margins OU.   Cardiovascular - Regular rate and rhythm with no murmur.  Mental Status -  Level of arousal and orientation to time, place, and person were intact. Language including expression, naming, repetition, comprehension was assessed and found intact. Fund of Knowledge was assessed and was intact.  Cranial Nerves II - XII - II - Visual field intact OU. III, IV, VI - Extraocular movements intact. V - Facial sensation intact bilaterally. VII - Facial movement intact bilaterally. VIII - Hearing & vestibular intact bilaterally. X - Palate elevates symmetrically. XI - Chin turning & shoulder shrug intact bilaterally. XII - Tongue protrusion intact.  Motor Strength - The patient's strength was normal in all extremities and pronator drift was absent.  Bulk was normal and fasciculations were absent.   Motor Tone - Muscle tone was assessed at the neck and appendages and was normal.  Reflexes - The patient's reflexes were 1+ in all extremities and he had no pathological reflexes.  Sensory - Light touch, temperature/pinprick, vibration and proprioception, and Romberg testing were assessed and were normal.    Coordination - The patient had normal movements in the hands and feet with  no ataxia or dysmetria.  Tremor was absent.  Gait and Station - The patient's transfers, posture, gait, station, and turns were observed as normal.  Data reviewed: I personally reviewed the images and agree with the radiology interpretations.  MRI brain 01/31/2015:  Abnormal MRI brain (without) demonstrating: 1. Mild chronic small vessel ischemic disease.  2. At least 7 punctate cerebral microhemorrhages noted in the bilateral cerebral hemispheres. This could be  related to underlying amyloid-based pathology vs chronic small vessel ischemic disease spectrum. 3. No acute findings.  CTA head and neck 03/04/15  1. Mild to moderate distal small vessel irregularity compatible with intracranial atherosclerotic disease versus less likely vasculitis. 2. No significant proximal stenosis, aneurysm, or branch vessel occlusion. 3. Mild atherosclerotic changes at the carotid bifurcations, worse on the left. There is no significant stenosis. 4. Minimal atherosclerotic calcifications at the origin of the subclavian arteries bilaterally. 5. Moderate spondylosis of the cervical spine is most evident at C6-7  Component     Latest Ref Rng 01/22/2015  Vitamin B-12     211 - 946 pg/mL 360  Folate     >3.0 ng/mL 11.3  TSH     0.450 - 4.500 uIU/mL 2.360    Assessment: As you may recall, he is a 80 y.o. Caucasian male with PMH of HLD, BPH., OSA on cpap, IBS, anxiety, and cystitis follow up in clinic for memory loss. Today in clinic for second opinion of microbleeding found in MRI. There are 7 total punctate MBs in bilateral hemisphere. The MBs are more cortical based, but also involving WMs. However, population based study showed that MBs are common in elderly patient without developing CAA. Especially with limited number of MBs, it is too early to say it could be CAA. Would continue monitor MBs with annual MRIs. ASA 80m once the other day is acceptable. However, BP control may be more of benefit. During the interval time, patient continue following with Dr. AJaynee Eagles Aspirin has been discontinued. Still has mild cognitive impairment diagnosed by Dr. ZValentina Shaggyin 08/2015 after neuropsych testing, which was recommended to repeat in 1 year.  Plan:  - follow up with MJinny BlossomNP next month for mild cognitive impairment and check memory test - [facility].  - will also repeat MRI brain to compare with  imaging one year ago.  - Follow-up with [facility] for memory deficit as suggested by Dr. AJaynee Eagles - Follow up with your primary care physician for stroke risk factor modification. Recommend maintain blood pressure goal <130/80, diabetes with hemoglobin A1c goal below 7.0% and lipids with LDL cholesterol goal below 70 mg/dL.  - recommend eye drops for dry eyes.  - follow up with MJinny Blossomnext month and later Megan/Dr. AJaynee Eagles - follow with me as needed.  Orders Placed This Encounter  Procedures  . MR BRAIN WO CONTRAST    Standing Status:   Future    Standing Expiration Date:   05/17/2017    Order Specific Question:   Reason for Exam (SYMPTOM  OR DIAGNOSIS REQUIRED)    Answer:   follow up MRI and compare with one year ago    Order Specific Question:   Preferred imaging location?    Answer:   Internal    Order Specific Question:   What is the patient's sedation requirement?    Answer:   No Sedation    Order Specific Question:   Does the patient have a pacemaker  or implanted devices?    Answer:   No    Meds ordered this encounter  Medications  . DISCONTD: rivastigmine (EXELON) 9.5 mg/24hr    Sig: PLACE 1 PATCH (9.5 MG TOTAL) ONTO THE SKIN DAILY.    Refill:  8  . fluorouracil (EFUDEX) 5 % cream    Sig: 1 APPLICATION APPLY TO THE SCALP NIGHTLY APPLY TO SCALP NIGHTLY FOR14 DAYS    Refill:  0  . traMADol (ULTRAM) 50 MG tablet    Sig: Take by mouth every 6 (six) hours as needed.  . ranitidine (ZANTAC) 75 MG tablet    Sig: Take 75 mg by mouth 2 (two) times daily.    Patient Instructions  - your still has condition called mild cognitive impairment  - will continue to monitor over time - you have appointment next month with Megan who can check memory test - recommend to see neuropsych Dr. Bonita Quin in [facility] next year for repeat neuropsych testing.  - will also repeat MRI brain to compare with imaging one year ago.  - Follow up with your primary care physician for stroke risk factor  modification. Recommend maintain blood pressure goal <130/80, diabetes with hemoglobin A1c goal below 7.0% and lipids with LDL cholesterol goal below 70 mg/dL.  - recommend eye drops for dry eyes.  - follow up with Jinny Blossom next month and also Megan/[name].    Rosalin Hawking, MD PhD [facility] [address] [place], [place] [address] [phone]

## 2016-03-27 DIAGNOSIS — N320 Bladder-neck obstruction: Secondary | ICD-10-CM | POA: Diagnosis not present

## 2016-03-27 DIAGNOSIS — E119 Type 2 diabetes mellitus without complications: Secondary | ICD-10-CM | POA: Diagnosis not present

## 2016-03-27 DIAGNOSIS — Z0000 Encounter for general adult medical examination without abnormal findings: Secondary | ICD-10-CM | POA: Diagnosis not present

## 2016-03-27 DIAGNOSIS — E782 Mixed hyperlipidemia: Secondary | ICD-10-CM | POA: Diagnosis not present

## 2016-03-27 DIAGNOSIS — E039 Hypothyroidism, unspecified: Secondary | ICD-10-CM | POA: Diagnosis not present

## 2016-03-27 DIAGNOSIS — Z79899 Other long term (current) drug therapy: Secondary | ICD-10-CM | POA: Diagnosis not present

## 2016-03-30 DIAGNOSIS — G3184 Mild cognitive impairment, so stated: Secondary | ICD-10-CM | POA: Diagnosis not present

## 2016-03-30 DIAGNOSIS — R030 Elevated blood-pressure reading, without diagnosis of hypertension: Secondary | ICD-10-CM | POA: Diagnosis not present

## 2016-03-30 DIAGNOSIS — E854 Organ-limited amyloidosis: Secondary | ICD-10-CM | POA: Diagnosis not present

## 2016-03-30 DIAGNOSIS — I680 Cerebral amyloid angiopathy: Secondary | ICD-10-CM | POA: Diagnosis not present

## 2016-04-08 ENCOUNTER — Ambulatory Visit
Admission: RE | Admit: 2016-04-08 | Discharge: 2016-04-08 | Disposition: A | Discharge status: Home / Self Care | Payer: PPO | Source: Ambulatory Visit | Attending: Neurology | Admitting: Neurology

## 2016-04-08 DIAGNOSIS — E854 Organ-limited amyloidosis: Secondary | ICD-10-CM

## 2016-04-08 DIAGNOSIS — I680 Cerebral amyloid angiopathy: Principal | ICD-10-CM

## 2016-04-08 DIAGNOSIS — R413 Other amnesia: Secondary | ICD-10-CM | POA: Diagnosis not present

## 2016-04-13 ENCOUNTER — Encounter: Payer: Self-pay | Admitting: Adult Health

## 2016-04-13 ENCOUNTER — Ambulatory Visit (INDEPENDENT_AMBULATORY_CARE_PROVIDER_SITE_OTHER): Payer: PPO | Admitting: Adult Health

## 2016-04-13 VITALS — BP 111/65 | HR 70 | Ht 69.0 in | Wt 206.2 lb

## 2016-04-13 DIAGNOSIS — G3184 Mild cognitive impairment, so stated: Secondary | ICD-10-CM

## 2016-04-13 MED ORDER — RIVASTIGMINE 9.5 MG/24HR TD PT24: 9.5000 mg | MEDICATED_PATCH | Freq: Every day | TRANSDERMAL | 5 refills | Status: DC

## 2016-04-13 NOTE — Progress Notes (Signed)
PATIENT: Dylan [name] DOB: December 10, 1935  REASON FOR VISIT: follow up HISTORY FROM: patient  HISTORY OF PRESENT ILLNESS:  Initial visit 01/22/15 (AA) - Dylan [name] is a 80 y.o. male here as a referral from Dr. Osborne Casco for memory changes. PMHx HLD, BPH., OSA on cpap, IBS, gerd,anxiety, cystitis Wifre provides most information. Most noticeable driving, he misses turns to places he has gone many times. The first time they noticed it was 2 years ago. It was night time. Progressively getting worse. He notices it as well. He forgets conversations, forgets names of people he just met. They live independently, pays bills, he remembers major things. Keeping track of appointments. 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 No behavioral issues. No hallucinations, delusions. No shuffling, no parkinsonian symptoms, no tremor. He is less active, his work is his hobbies and he still enjoys running his company. Had lab tests recently and imaging but unsure what kind. No other focal neurologic complaints. No FHx alzheimers.  Follow up 02/11/15 (AA) - Patient returns today to discuss MRI of the brain. MRI of the brain showed nonspecific white matter changes as well as possible cerebral amyloid angiopathy. Small subclinical leaks microhemorrhages are seen in cerebral amyloid angiopathy as was seen in the MRI of his brain. Discussion with patient and his wife about cerebral amyloid angiopathy, amyloid beta peptide protein deposits in the smaller sized blood vessels of the brain. The biggest risk factor for this disorder his age. There is a risk for intracerebral hemorrhage with this condition. Unfortunately spontaneous lobar hemorrhage can be a manifestation of this disorder. There can also be neurologic symptoms in association with this disorder. I suggested avoiding anticoagulants and antiplatelet agents. Control of blood pressure is always advisable. Patient and his wife  would like to see a stroke specialist to discuss this further. I recommended them to my colleague Dr. Erlinda Hong.   03/07/15 follow up - the patient has been doing the same. He had MRI brain on 01/31/15 showed 7 punctate cerebral microhemorrhages noted in the bilateral cerebral hemispheres. As reported "this could be related to underlying amyloid-based pathology vs. Chronic small vessel ischemic disease spectrum". He also had a CTA head and neck which was unremarkable. He denies any ischemic or hemorrhagic stroke in the past. Currently on Aricept, just increased from 5 mg to 10 mg daily. No side effects. He is on aspirin 81 mg every other day and Zocor for stroke prevention. His symptoms very anxious about amyloid angiopathy diagnosis and worry about bleeding risk and memory loss. Blood pressure today 133/79. He has no history of hypertension and is not on any blood pressure medication.  07/30/2015 follow up (AA): 80 year old highly functional male with memory changes. 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 No behavioral issues. No hallucinations, delusions. No shuffling, no parkinsonian symptoms, no tremor. Memory is a little worse per wife. His stress level has been very high. The last month.At this time she saw more "slipping: because he had so much on his mind. He couldn't have a conversation that was complete. Had diarrhea with Aricept and was placed on a patch last week. Rivastigmine. He has OSA and is compliant with his mask Will refer for neurocognitive testing. He is having headaches and ringing on the ears.  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 Wife  is here, feels he is sleeping more during the day in front of TV, not exercising and needs to de-stress his life. Advised him to exercise, and manage stress. He says he has a lot going on. He has been more stressed this year. They  started involving his children in his businesses. He is compliant with his cpap. He is compliant with his Exelon.Discussed mood and if he feels depressed we can treat this. Wife would like a referral to a specialty memory clinic and I am very supportive of that. I encourage them to discuss with Dr. Osborne Casco for referral to [facility] or [facility] memory clinic. Discussed the difference between Mild and major cognitive impairment and dementia.   Interval History 03/17/16: During the interval time, he has been followed with [name]. I am not sure why pt has appointment with me today. However, he will has appointment with Jinny Blossom next month and also with [facility] for memory deficit in Jan 2018. Anyway, he has been stable without significant change. On exelon patch now and will need to repeat MRI to monitoring microbleeds. BP 123/77.   Today 04/13/2016 (MM):  Dylan [name] is a 80 year old male with a history of mild cognitive impairment. He returns today for follow-up. Overall he feels that he's been doing well. He is currently on Exelon patch. He reports that he did try the higher dose in the past however he was unable to tolerate it. He states this weekend he went to [place] and accidentallye picked up the wrong box. So for the last 3 days he's been doing the higher dose and reports that he's been tolerating it well. He feels that his memory has remained stable. He is able to complete all ADLs independently. He operates a Teacher, music without difficulty. Denies any trouble sleeping. Good appetite. No hallucinations. No changes in mood or behavior. He returns today for an evaluation.   REVIEW OF SYSTEMS: Out of a complete 14 system review of symptoms, the patient complains only of the following symptoms, and all other reviewed systems are negative.  See history of present illness  ALLERGIES: No Known Allergies  HOME MEDICATIONS: Outpatient Medications Prior to Visit  Medication Sig Dispense Refill  .  fluorouracil (EFUDEX) 5 % cream 1 APPLICATION APPLY TO THE SCALP NIGHTLY APPLY TO SCALP NIGHTLY FOR14 DAYS  0  . pentosan polysulfate (ELMIRON) 100 MG capsule Take 100 mg by mouth as needed. One  times daily    . potassium citrate (UROCIT-K) 10 MEQ (1080 MG) SR tablet Take 2 tablets by mouth daily.  11  . ranitidine (ZANTAC) 75 MG tablet Take 75 mg by mouth 2 (two) times daily.    . rivastigmine (EXELON) 4.6 mg/24hr Place 1 patch (4.6 mg total) onto the skin daily. 30 patch 12  . sertraline (ZOLOFT) 50 MG tablet Take 25 mg by mouth Daily.     . simvastatin (ZOCOR) 80 MG tablet Take 80 mg by mouth at bedtime.      . tamsulosin (FLOMAX) 0.4 MG CAPS capsule Take 0.4 mg by mouth daily.  11  . traMADol (ULTRAM) 50 MG tablet Take by mouth every 6 (six) hours as needed.     No facility-administered medications prior to visit.     PAST MEDICAL HISTORY: Past Medical History:  Diagnosis Date  . High cholesterol   . Kidney stone   . Memory loss   . Sleep apnea    NPSG 08-03-07 AHI 10/hr, RDI 19.7/hr    PAST SURGICAL HISTORY:  Past Surgical History:  Procedure Laterality Date  . right clavicle repair  1954   fx  . TONSILLECTOMY  1942  . WISDOM TOOTH EXTRACTION  1961    FAMILY HISTORY: Family History  Problem Relation Age of Onset  . Heart disease Father   . Stroke Father   . Cancer Mother   . Pulmonary embolism Mother     SOCIAL HISTORY: Social History   Social History  . Marital status: Married    Spouse name: [name]   . Number of children: 3  . Years of education: 12+   Occupational History  . Real Estate-rental properties    Social History Main Topics  . Smoking status: Never Smoker  . Smokeless tobacco: Never Used  . Alcohol use 0.6 oz/week    1 Glasses of wine per week     Comment: Socially   . Drug use: No  . Sexual activity: Not on file   Other Topics Concern  . Not on file   Social History Narrative   Lives at home with wife, [name].   Caffeine use: occasional            PHYSICAL EXAM  Vitals:   04/13/16 0925  BP: 111/65  Pulse: 70  Weight: 206 lb 3.2 oz (93.5 kg)  Height: '5\' 9"'  (1.753 m)   Body mass index is 30.45 kg/m.   Montreal Cognitive Assessment  04/13/2016 07/30/2015 01/22/2015  Visuospatial/ Executive (0/5) '5 4 4  ' Naming (0/3) '3 3 3  ' Attention: Read list of digits (0/2) '2 2 2  ' Attention: Read list of letters (0/1) '1 1 1  ' Attention: Serial 7 subtraction starting at 100 (0/3) '3 3 3  ' Language: Repeat phrase (0/2) '1 2 1  ' Language : Fluency (0/1) '1 1 1  ' Abstraction (0/2) '2 2 2  ' Delayed Recall (0/5) 1 0 1  Orientation (0/6) '4 6 5  ' Total '23 24 23  ' Adjusted Score (based on education) '23 24 23     ' Generalized: Well developed, in no acute distress   Neurological examination  Mentation: Alert oriented to time, place, history taking. Follows all commands speech and language fluent Cranial nerve II-XII: Pupils were equal round reactive to light. Extraocular movements were full, visual field were full on confrontational test. Facial sensation and strength were normal. Uvula tongue midline. Head turning and shoulder shrug  were normal and symmetric. Motor: The motor testing reveals 5 over 5 strength of all 4 extremities. Good symmetric motor tone is noted throughout.  Sensory: Sensory testing is intact to soft touch on all 4 extremities. No evidence of extinction is noted.  Coordination: Cerebellar testing reveals good finger-nose-finger and heel-to-shin bilaterally.  Gait and station: Gait is normal. Tandem gait is normal. Romberg is negative. No drift is seen.  Reflexes: Deep tendon reflexes are symmetric and normal bilaterally.   DIAGNOSTIC DATA (LABS, IMAGING, TESTING) - I reviewed patient records, labs, notes, testing and imaging myself where available.   ASSESSMENT AND PLAN 80 y.o. year old male  has a past medical history of High cholesterol; Kidney stone; Memory loss; and Sleep apnea. here with:  1: Mild memory  impairment  The patient's memory score has remained stable. He will continue on Exelon patch. We will increase his dose to 9.5 mg. If he is unable to tolerate this we will decrease back to 4.6 mg. Patient is amenable to this plan. He will follow-up in 6 months or sooner if needed.      Ward Givens,  MSN, NP-C 04/13/2016, 9:43 AM [facility] [address], [place], [place] [address] [phone]

## 2016-04-13 NOTE — Progress Notes (Signed)
I have read the note, and I agree with the clinical assessment and plan.  Muzamil Harker A. Amish Mintzer, MD, PhD Certified in Neurology, Clinical Neurophysiology, Sleep Medicine, Pain Medicine and Neuroimaging  [facility] [address], [address] [place], [place] [address] [phone]  

## 2016-04-13 NOTE — Patient Instructions (Signed)
Increase Exelon to 9.5 mg patch Memory score is stable If your symptoms worsen or you develop new symptoms please let us know.

## 2016-04-14 ENCOUNTER — Telehealth: Payer: Self-pay

## 2016-04-14 NOTE — Telephone Encounter (Signed)
-----   Message from Rosalin Hawking, MD sent at 04/11/2016  6:35 AM EST ----- Could you please let the patient know that the MRI brain test done recently in our office was unchanged from last year. Please continue current treatment. Thanks.  Rosalin Hawking, MD PhD Stroke Neurology 04/11/2016 6:35 AM

## 2016-04-14 NOTE — Telephone Encounter (Signed)
Rn call patients wife about the MRI brain. Pt has memory issues. Rn told wife his MRI was unchanged from last year. Continue treatment plan. Pts wife verbalized understanding. ------

## 2016-04-15 DIAGNOSIS — N401 Enlarged prostate with lower urinary tract symptoms: Secondary | ICD-10-CM | POA: Diagnosis not present

## 2016-04-15 DIAGNOSIS — N50819 Testicular pain, unspecified: Secondary | ICD-10-CM | POA: Diagnosis not present

## 2016-04-15 DIAGNOSIS — R351 Nocturia: Secondary | ICD-10-CM | POA: Diagnosis not present

## 2016-04-15 DIAGNOSIS — R102 Pelvic and perineal pain: Secondary | ICD-10-CM | POA: Diagnosis not present

## 2016-04-15 DIAGNOSIS — R1030 Lower abdominal pain, unspecified: Secondary | ICD-10-CM | POA: Diagnosis not present

## 2016-04-19 ENCOUNTER — Encounter: Payer: Self-pay | Admitting: Internal Medicine

## 2016-04-19 NOTE — Assessment & Plan Note (Signed)
He doesn't seem much aware of limb jerks now. We don't have a witness report today

## 2016-04-19 NOTE — Assessment & Plan Note (Signed)
Quality of life definitely improved with CPAP and he describes excellent compliance. We need to arrange download or add [employer]

## 2016-05-08 ENCOUNTER — Other Ambulatory Visit (HOSPITAL_COMMUNITY): Payer: Self-pay | Admitting: Urology

## 2016-05-08 DIAGNOSIS — R1031 Right lower quadrant pain: Secondary | ICD-10-CM

## 2016-05-08 DIAGNOSIS — R109 Unspecified abdominal pain: Secondary | ICD-10-CM

## 2016-05-14 ENCOUNTER — Ambulatory Visit (HOSPITAL_COMMUNITY)
Admission: RE | Admit: 2016-05-14 | Discharge: 2016-05-14 | Disposition: A | Discharge status: Home / Self Care | Payer: PPO | Source: Ambulatory Visit | Attending: Urology | Admitting: Urology

## 2016-05-14 ENCOUNTER — Other Ambulatory Visit (HOSPITAL_COMMUNITY): Payer: Self-pay | Admitting: Urology

## 2016-05-14 DIAGNOSIS — R1031 Right lower quadrant pain: Secondary | ICD-10-CM

## 2016-05-14 DIAGNOSIS — K5730 Diverticulosis of large intestine without perforation or abscess without bleeding: Secondary | ICD-10-CM | POA: Insufficient documentation

## 2016-05-14 DIAGNOSIS — N138 Other obstructive and reflux uropathy: Secondary | ICD-10-CM | POA: Insufficient documentation

## 2016-05-14 DIAGNOSIS — N401 Enlarged prostate with lower urinary tract symptoms: Secondary | ICD-10-CM | POA: Diagnosis not present

## 2016-05-14 DIAGNOSIS — R1030 Lower abdominal pain, unspecified: Secondary | ICD-10-CM | POA: Diagnosis not present

## 2016-05-20 DIAGNOSIS — H33321 Round hole, right eye: Secondary | ICD-10-CM | POA: Diagnosis not present

## 2016-05-20 DIAGNOSIS — H43813 Vitreous degeneration, bilateral: Secondary | ICD-10-CM | POA: Diagnosis not present

## 2016-05-20 DIAGNOSIS — H2513 Age-related nuclear cataract, bilateral: Secondary | ICD-10-CM | POA: Diagnosis not present

## 2016-05-20 DIAGNOSIS — H31009 Unspecified chorioretinal scars, unspecified eye: Secondary | ICD-10-CM | POA: Diagnosis not present

## 2016-05-21 DIAGNOSIS — N401 Enlarged prostate with lower urinary tract symptoms: Secondary | ICD-10-CM | POA: Diagnosis not present

## 2016-06-09 DIAGNOSIS — H2513 Age-related nuclear cataract, bilateral: Secondary | ICD-10-CM | POA: Diagnosis not present

## 2016-06-09 DIAGNOSIS — H31091 Other chorioretinal scars, right eye: Secondary | ICD-10-CM | POA: Diagnosis not present

## 2016-07-10 ENCOUNTER — Telehealth: Payer: Self-pay | Admitting: Diagnostic Neuroimaging

## 2016-07-10 NOTE — Telephone Encounter (Signed)
Patient called in about zantac medication for reflux. I advised him to discuss with his PCP. -VRP

## 2016-07-22 DIAGNOSIS — M6281 Muscle weakness (generalized): Secondary | ICD-10-CM | POA: Diagnosis not present

## 2016-07-22 DIAGNOSIS — M62838 Other muscle spasm: Secondary | ICD-10-CM | POA: Diagnosis not present

## 2016-07-22 DIAGNOSIS — R102 Pelvic and perineal pain: Secondary | ICD-10-CM | POA: Diagnosis not present

## 2016-07-22 DIAGNOSIS — R1030 Lower abdominal pain, unspecified: Secondary | ICD-10-CM | POA: Diagnosis not present

## 2016-07-27 DIAGNOSIS — K5900 Constipation, unspecified: Secondary | ICD-10-CM | POA: Diagnosis not present

## 2016-07-27 DIAGNOSIS — R1030 Lower abdominal pain, unspecified: Secondary | ICD-10-CM | POA: Diagnosis not present

## 2016-07-27 DIAGNOSIS — M6281 Muscle weakness (generalized): Secondary | ICD-10-CM | POA: Diagnosis not present

## 2016-07-27 DIAGNOSIS — M62838 Other muscle spasm: Secondary | ICD-10-CM | POA: Diagnosis not present

## 2016-07-27 DIAGNOSIS — R102 Pelvic and perineal pain: Secondary | ICD-10-CM | POA: Diagnosis not present

## 2016-07-29 DIAGNOSIS — E7800 Pure hypercholesterolemia, unspecified: Secondary | ICD-10-CM | POA: Diagnosis not present

## 2016-07-29 DIAGNOSIS — R7302 Impaired glucose tolerance (oral): Secondary | ICD-10-CM | POA: Diagnosis not present

## 2016-07-29 DIAGNOSIS — Z125 Encounter for screening for malignant neoplasm of prostate: Secondary | ICD-10-CM | POA: Diagnosis not present

## 2016-07-31 DIAGNOSIS — K219 Gastro-esophageal reflux disease without esophagitis: Secondary | ICD-10-CM | POA: Diagnosis not present

## 2016-07-31 DIAGNOSIS — R972 Elevated prostate specific antigen [PSA]: Secondary | ICD-10-CM | POA: Diagnosis not present

## 2016-07-31 DIAGNOSIS — R7302 Impaired glucose tolerance (oral): Secondary | ICD-10-CM | POA: Diagnosis not present

## 2016-07-31 DIAGNOSIS — D692 Other nonthrombocytopenic purpura: Secondary | ICD-10-CM | POA: Diagnosis not present

## 2016-07-31 DIAGNOSIS — Z6829 Body mass index (BMI) 29.0-29.9, adult: Secondary | ICD-10-CM | POA: Diagnosis not present

## 2016-07-31 DIAGNOSIS — F0390 Unspecified dementia without behavioral disturbance: Secondary | ICD-10-CM | POA: Diagnosis not present

## 2016-07-31 DIAGNOSIS — F419 Anxiety disorder, unspecified: Secondary | ICD-10-CM | POA: Diagnosis not present

## 2016-07-31 DIAGNOSIS — Z0000 Encounter for general adult medical examination without abnormal findings: Secondary | ICD-10-CM | POA: Diagnosis not present

## 2016-07-31 DIAGNOSIS — N3010 Interstitial cystitis (chronic) without hematuria: Secondary | ICD-10-CM | POA: Diagnosis not present

## 2016-07-31 DIAGNOSIS — E7800 Pure hypercholesterolemia, unspecified: Secondary | ICD-10-CM | POA: Diagnosis not present

## 2016-07-31 DIAGNOSIS — Z1389 Encounter for screening for other disorder: Secondary | ICD-10-CM | POA: Diagnosis not present

## 2016-07-31 DIAGNOSIS — N401 Enlarged prostate with lower urinary tract symptoms: Secondary | ICD-10-CM | POA: Diagnosis not present

## 2016-08-03 DIAGNOSIS — R102 Pelvic and perineal pain: Secondary | ICD-10-CM | POA: Diagnosis not present

## 2016-08-03 DIAGNOSIS — M6281 Muscle weakness (generalized): Secondary | ICD-10-CM | POA: Diagnosis not present

## 2016-08-03 DIAGNOSIS — M62838 Other muscle spasm: Secondary | ICD-10-CM | POA: Diagnosis not present

## 2016-08-03 DIAGNOSIS — R1030 Lower abdominal pain, unspecified: Secondary | ICD-10-CM | POA: Diagnosis not present

## 2016-10-12 ENCOUNTER — Encounter: Payer: Self-pay | Admitting: Adult Health

## 2016-10-12 ENCOUNTER — Ambulatory Visit (INDEPENDENT_AMBULATORY_CARE_PROVIDER_SITE_OTHER): Payer: PPO | Admitting: Adult Health

## 2016-10-12 VITALS — BP 134/71 | HR 77 | Ht 70.0 in | Wt 200.0 lb

## 2016-10-12 DIAGNOSIS — R413 Other amnesia: Secondary | ICD-10-CM | POA: Diagnosis not present

## 2016-10-12 MED ORDER — MEMANTINE HCL 28 X 5 MG & 21 X 10 MG PO TABS: ORAL_TABLET | ORAL | 0 refills | Status: DC

## 2016-10-12 NOTE — Progress Notes (Signed)
  PATIENT: Dylan [name] DOB: 09/27/1935  REASON FOR VISIT: follow up HISTORY FROM: patient  HISTORY OF PRESENT ILLNESS: HISTORY Initial visit 01/22/15 (AA) - Dylan [name] is a 81 y.o. male here as a referral from Dr. Tisovec for memory changes. PMHx HLD, BPH., OSA on cpap, IBS, gerd,anxiety, cystitis [place] provides most information. Most noticeable driving, he misses turns to places he has gone many times. The first time they noticed it was 2 years ago. It was night time. Progressively getting worse. He notices it as well. He forgets conversations, forgets names of people he just met. They live independently, pays bills, he remembers major things. Keeping track of appointments. 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 No behavioral issues. No hallucinations, delusions. No shuffling, no parkinsonian symptoms, no tremor. He is less active, his work is his hobbies and he still enjoys running his company. Had lab tests recently and imaging but unsure what kind. No other focal neurologic complaints. No FHx alzheimers.  Follow up 02/11/15 (AA) - Patient returns today to discuss MRI of the brain. MRI of the brain showed nonspecific white matter changes as well as possible cerebral amyloid angiopathy. Small subclinical leaks microhemorrhages are seen in cerebral amyloid angiopathy as was seen in the MRI of his brain. Discussion with patient and his wife about cerebral amyloid angiopathy, amyloid beta peptide protein deposits in the smaller sized blood vessels of the brain. The biggest risk factor for this disorder his age. There is a risk for intracerebral hemorrhage with this condition. Unfortunately spontaneous lobar hemorrhage can be a manifestation of this disorder. There can also be neurologic symptoms in association with this disorder. I suggested avoiding anticoagulants and antiplatelet agents. Control of blood pressure is always advisable. Patient and his  wife would like to see a stroke specialist to discuss this further. I recommended them to my colleague Dr. Xu.   03/07/15 follow up - the patient has been doing the same.He had MRI brain on 01/31/15 showed 7 punctate cerebral microhemorrhages noted in the bilateral cerebral hemispheres. As reported "this could be related to underlying amyloid-based pathology vs. Chronic small vessel ischemic disease spectrum". He also had a CTA head and neck which was unremarkable. He denies any ischemic or hemorrhagic stroke in the past. Currently on Aricept, just increased from 5 mg to 10 mg daily. No side effects. He is on aspirin 81 mg every other day and Zocor for stroke prevention. His symptoms very anxious about amyloid angiopathy diagnosis and worry about bleeding risk and memory loss. Blood pressure today 133/79. He has no history of hypertension and is not on any blood pressure medication.  07/30/2015 follow up (AA): 80 year old highly functional male with memory changes. 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 No behavioral issues. No hallucinations, delusions. No shuffling, no parkinsonian symptoms, no tremor. Memory is a little worse per wife. His stress level has been very high. The last month.At this time she saw more "slipping: because he had so much on his mind. He couldn't have a conversation that was complete. Had diarrhea with Aricept and was placed on a patch last week. Rivastigmine. He has OSA and is compliant with his mask Will refer for neurocognitive testing. He is having headaches and ringing on the ears.  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 Wife is   here, feels he is sleeping more during the day in front of TV, not exercising and needs to de-stress his life. Advised him to exercise, and manage stress. He says he has a lot going on. He has been more stressed this year.  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 He is compliant with his cpap. He is compliant with his Exelon.Discussed mood and if he feels depressed we can treat this. Wife would like a referral to a specialty memory clinic and I am very supportive of that. I encourage them to discuss with Dr. Osborne Casco for referral to [facility] or [facility] memory clinic. Discussed the difference between Mild and major cognitive impairment and dementia.   Interval History 03/17/16: During the interval time, he has been followed with [name]. I am not sure why pt has appointment with me today. However, he will has appointment with Jinny Blossom next month and also with [facility] neurology for memory deficit in Jan 2018. Anyway, he has been stable without significant change. On exelon patch now and will need to repeat MRI to monitoring microbleeds. BP 123/77.   Update 04/13/2016 (MM):  Dylan [name] is a 81 year old male with a history of mild cognitive impairment. He returns today for follow-up. Overall he feels that he's been doing well. He is currently on Exelon patch. He reports that he did try the higher dose in the past however he was unable to tolerate it. He states this weekend he went to [place] and accidentallye picked up the wrong box. So for the last 3 days he's been doing the higher dose and reports that he's been tolerating it well. He feels that his memory has remained stable. He is able to complete all ADLs independently. He operates a Teacher, music without difficulty. Denies any trouble sleeping. Good appetite. No hallucinations. No changes in mood or behavior. He returns today for an evaluation.  Today 10/12/2016:  Dylan [name] is a 81 year old male with a history of mild cognitive impairment. He returns today for follow-up. He is currently on the Exelon patch 9.5 mg. He reports that he is tolerating this well although he does notice ringing in the ears when he wears the patch. He states that if he does not put the patch  on he does not have this symptom. He reports that this is tolerable at this time and does not want to stop the medication. He is able to complete all ADLs independently. 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 Denies any trouble sleeping. Denies any changes with his mood or behavior. His wife states that he's had to give up some duties with their business. He returns today for an evaluation.    REVIEW OF SYSTEMS: Out of a complete 14 system review of symptoms, the patient complains only of the following symptoms, and all other reviewed systems are negative.  Eye itching, light sensitivity, eye pain, ringing in ears  ALLERGIES: No Known Allergies  HOME MEDICATIONS: Outpatient Medications Prior to Visit  Medication Sig Dispense Refill  . fluorouracil (EFUDEX) 5 % cream 1 APPLICATION APPLY TO THE SCALP NIGHTLY APPLY TO SCALP NIGHTLY FOR14 DAYS  0  . pentosan polysulfate (ELMIRON) 100 MG capsule Take 100 mg by mouth as needed. One  times daily    . potassium citrate (UROCIT-K) 10 MEQ (1080 MG) SR tablet Take 2 tablets by mouth 2 (two) times daily.   11  . ranitidine (ZANTAC) 75 MG tablet Take 75 mg  by mouth 2 (two) times daily.    . rivastigmine (EXELON) 9.5 mg/24hr Place 1 patch (9.5 mg total) onto the skin daily. 30 patch 5  . sertraline (ZOLOFT) 50 MG tablet Take 25 mg by mouth 2 (two) times daily.     . simvastatin (ZOCOR) 80 MG tablet Take 80 mg by mouth at bedtime.      . tamsulosin (FLOMAX) 0.4 MG CAPS capsule Take 0.4 mg by mouth daily.  11   No facility-administered medications prior to visit.     PAST MEDICAL HISTORY: Past Medical History:  Diagnosis Date  . High cholesterol   . Kidney stone   . Memory loss   . Sleep apnea    NPSG 08-03-07 AHI 10/hr, RDI 19.7/hr    PAST SURGICAL HISTORY: Past Surgical History:  Procedure Laterality Date  . right clavicle repair  1954   fx  . TONSILLECTOMY  1942  . WISDOM TOOTH  EXTRACTION  1961    FAMILY HISTORY: Family History  Problem Relation Age of Onset  . Heart disease Father   . Stroke Father   . Cancer Mother   . Pulmonary embolism Mother     SOCIAL HISTORY: Social History   Social History  . Marital status: Married    Spouse name: Pat   . Number of children: 3  . Years of education: 12+   Occupational History  . Real Estate-rental properties    Social History Main Topics  . Smoking status: Never Smoker  . Smokeless tobacco: Never Used  . Alcohol use 0.6 oz/week    1 Glasses of wine per week     Comment: Socially   . Drug use: No  . Sexual activity: Not on file   Other Topics Concern  . Not on file   Social History Narrative   Lives at home with wife, Pat.   Caffeine use: occasional          PHYSICAL EXAM  Vitals:   10/12/16 0919  BP: 134/71  Pulse: 77  Weight: 200 lb (90.7 kg)  Height: 5' 10" (1.778 m)   Body mass index is 28.7 kg/m.   Montreal Cognitive Assessment  10/12/2016 04/13/2016 07/30/2015 01/22/2015  Visuospatial/ Executive (0/5) 4 5 4 4  Naming (0/3) 3 3 3 3  Attention: Read list of digits (0/2) 1 2 2 2  Attention: Read list of letters (0/1) 1 1 1 1  Attention: Serial 7 subtraction starting at 100 (0/3) 3 3 3 3  Language: Repeat phrase (0/2) 1 1 2 1  Language : Fluency (0/1) 1 1 1 1  Abstraction (0/2) 2 2 2 2  Delayed Recall (0/5) 0 1 0 1  Orientation (0/6) 4 4 6 5  Total 20 23 24 23  Adjusted Score (based on education) 20 23 24 23     Generalized: Well developed, in no acute distress   Neurological examination  Mentation: Alert oriented to time, place, history taking. Follows all commands speech and language fluent Cranial nerve II-XII: Pupils were equal round reactive to light. Extraocular movements were full, visual field were full on confrontational test. Facial sensation and strength were normal. Uvula tongue midline. Head turning and shoulder shrug  were normal and symmetric. Motor: The  motor testing reveals 5 over 5 strength of all 4 extremities. Good symmetric motor tone is noted throughout.  Sensory: Sensory testing is intact to soft touch on all 4 extremities. No evidence of extinction is noted.  Coordination: Cerebellar testing reveals   good finger-nose-finger and heel-to-shin bilaterally.  Gait and station: Gait is normal.    DIAGNOSTIC DATA (LABS, IMAGING, TESTING) - I reviewed patient records, labs, notes, testing and imaging myself where available.      Component Value Date/Time   BUN 19 02/22/2015 1614   CREATININE 1.07 02/22/2015 1614    Lab Results  Component Value Date   VITAMINB12 360 01/22/2015   Lab Results  Component Value Date   TSH 2.360 01/22/2015      ASSESSMENT AND PLAN 81 y.o. year old male  has a past medical history of High cholesterol; Kidney stone; Memory loss; and Sleep apnea. here with:  1. Memory disturbance  The patient's memory score has declined since the last visit. For now he will remain on the Exelon patch. We will start Namenda titration pack. On  Week four they will call me for the maintenance dose. Patient is advised that if his symptoms worsen or he develops new symptoms he should let us know. He will follow-up in 6 months with [name].  I spent 15 minutes with the patient 50% of this time was spent discussing Namenda.    Ward Givens, MSN, NP-C 10/12/2016, 9:33 AM [facility] [address], [place], [place] [phone]

## 2016-10-12 NOTE — Patient Instructions (Addendum)
Continue Exelon  Start Namenda titration pack Call at week 4 for maintenance dose.   Memantine Tablets What is this medicine? MEMANTINE (MEM an teen) is used to treat dementia caused by Alzheimer's disease. This medicine may be used for other purposes; ask your health care provider or pharmacist if you have questions. COMMON BRAND NAME(S): Namenda What should I tell my health care provider before I take this medicine? They need to know if you have any of these conditions: -difficulty passing urine -kidney disease -liver disease -seizures -an unusual or allergic reaction to memantine, other medicines, foods, dyes, or preservatives -pregnant or trying to get pregnant -breast-feeding How should I use this medicine? Take this medicine by mouth with a glass of water. Follow the directions on the prescription label. You may take this medicine with or without food. Take your doses at regular intervals. Do not take your medicine more often than directed. Continue to take your medicine even if you feel better. Do not stop taking except on the advice of your doctor or health care professional. Talk to your pediatrician regarding the use of this medicine in children. Special care may be needed. Overdosage: If you think you have taken too much of this medicine contact a poison control center or emergency room at once. NOTE: This medicine is only for you. Do not share this medicine with others. What if I miss a dose? If you miss a dose, take it as soon as you can. If it is almost time for your next dose, take only that dose. Do not take double or extra doses. If you do not take your medicine for several days, contact your health care provider. Your dose may need to be changed. What may interact with this medicine? -acetazolamide -amantadine -cimetidine -dextromethorphan -dofetilide -hydrochlorothiazide -ketamine -metformin -methazolamide -quinidine -ranitidine -sodium  bicarbonate -triamterene This list may not describe all possible interactions. Give your health care provider a list of all the medicines, herbs, non-prescription drugs, or dietary supplements you use. Also tell them if you smoke, drink alcohol, or use illegal drugs. Some items may interact with your medicine. What should I watch for while using this medicine? Visit your doctor or health care professional for regular checks on your progress. Check with your doctor or health care professional if there is no improvement in your symptoms or if they get worse. You may get drowsy or dizzy. Do not drive, use machinery, or do anything that needs mental alertness until you know how this drug affects you. Do not stand or sit up quickly, especially if you are an older patient. This reduces the risk of dizzy or fainting spells. Alcohol can make you more drowsy and dizzy. Avoid alcoholic drinks. What side effects may I notice from receiving this medicine? Side effects that you should report to your doctor or health care professional as soon as possible: -allergic reactions like skin rash, itching or hives, swelling of the face, lips, or tongue -agitation or a feeling of restlessness -depressed mood -dizziness -hallucinations -redness, blistering, peeling or loosening of the skin, including inside the mouth -seizures -vomiting Side effects that usually do not require medical attention (report to your doctor or health care professional if they continue or are bothersome): -constipation -diarrhea -headache -nausea -trouble sleeping This list may not describe all possible side effects. Call your doctor for medical advice about side effects. You may report side effects to FDA at 1-800-FDA-1088. Where should I keep my medicine? Keep out of the reach of children.  Store at room temperature between 15 degrees and 30 degrees C (59 degrees and 86 degrees F). Throw away any unused medicine after the expiration  date. NOTE: This sheet is a summary. It may not cover all possible information. If you have questions about this medicine, talk to your doctor, pharmacist, or health care provider.  2018 Elsevier/Gold Standard (2013-03-06 14:10:42)

## 2016-10-27 NOTE — Progress Notes (Signed)
Personally  participated in, made any corrections needed, and agree with history, physical, neuro exam,assessment and plan as stated above.    Bethanee Redondo, MD [facility] 

## 2016-11-11 ENCOUNTER — Telehealth: Payer: Self-pay | Admitting: Adult Health

## 2016-11-11 ENCOUNTER — Ambulatory Visit (INDEPENDENT_AMBULATORY_CARE_PROVIDER_SITE_OTHER): Payer: PPO | Admitting: Internal Medicine

## 2016-11-11 ENCOUNTER — Encounter: Payer: Self-pay | Admitting: Internal Medicine

## 2016-11-11 VITALS — BP 118/76 | HR 70 | Ht 70.0 in | Wt 202.6 lb

## 2016-11-11 DIAGNOSIS — G4733 Obstructive sleep apnea (adult) (pediatric): Secondary | ICD-10-CM | POA: Diagnosis not present

## 2016-11-11 DIAGNOSIS — G3184 Mild cognitive impairment, so stated: Secondary | ICD-10-CM | POA: Diagnosis not present

## 2016-11-11 DIAGNOSIS — G2581 Restless legs syndrome: Secondary | ICD-10-CM | POA: Diagnosis not present

## 2016-11-11 MED ORDER — MEMANTINE HCL 10 MG PO TABS: 10.0000 mg | ORAL_TABLET | Freq: Two times a day (BID) | ORAL | 5 refills | Status: DC

## 2016-11-11 NOTE — Addendum Note (Signed)
Addended by: Trudie Buckler on: 11/11/2016 03:39 PM   Modules accepted: Orders

## 2016-11-11 NOTE — Assessment & Plan Note (Signed)
He says that this complaint has not bothered him for quite a while. I'm not sure what has made the difference.

## 2016-11-11 NOTE — Telephone Encounter (Signed)
Patients wife called office in reference to memantine [facility] TITRATION PAK) tablet pack.  Patient states he feels level, but per wife she feels husband is not getting better and has seen some decline.  Please call

## 2016-11-11 NOTE — Patient Instructions (Signed)
We can continue CPAP 7/ Advanced, mask of choice, humidifier supplies, AirView      Dx OSA  We don't need to make any changes today.   Please call if we can help

## 2016-11-11 NOTE — Telephone Encounter (Signed)
Rn call patients wife on dpr form. Pts wife stated her husband is almost done with the namenda titration pack. She states per her husband he feels level,and is nor having any ups and downs. Pt is doing well on the titration pack. Pts wife stated she was told by Jinny Blossom NP to call the office for the ongoing medication refill. Pt having no side effects from titration pack. Rn stated a message will be sent to  [facility] NP about doing rx for namenda.

## 2016-11-11 NOTE — Progress Notes (Signed)
Subjective:    Patient ID: Dylan [name], male    DOB: [birth year], 81 y.o.   MRN: [id number]  HPI M Never smoker followed for OSA, Restless Legs, hx allergy.  -----------------------------------------------------------------------------------------  11/11/2015-81 year old male never smoker followed for OSA, restless legs, history allergic rhinitis, complicated by GERD CPAP 7/Advanced FOLLOWS FOR:DME:[facility]; wears CPAP every night for about 8-9 hours. Pressure working well for patient. No DL today. Will need to order for DL and enroll in [place]. "Not satisfied" with full face mask. We discussed options. He feels pressure is comfortable and he describes excellent compliance, documented on download at last year's visit. Failed oral appliance trial.  73/33- 81 year old male never smoker followed for OSA, restless legs, history allergic rhinitis, complicated by GERD,  CPAP 7/Advanced 1 yr follow up for OSA. Pt states breathing has been ok since last visit. Pt uses [facility] as DME.  Being followed by neurology for mild cognitive impairment-possibly cerebral amyloid angiopathy, with some memory loss. He says he is very comfortable with CPAP and it continues to work well for him. Download confirms 96% 4 hour compliance with AHI 0.2/hour. He says restless legs are no longer eye disturbance.  ROS-see HPI    + = pos Constitutional:   No-   weight loss, night sweats, fevers, chills, fatigue, lassitude. HEENT:   No-  headaches, difficulty swallowing, tooth/dental problems, sore throat,       No-  sneezing, itching, ear ache, nasal congestion, post nasal drip,  CV:  No-   chest pain, orthopnea, PND, swelling in lower extremities, anasarca, dizziness, palpitations Resp:-  shortness of breath with exertion or at rest.              No-   productive cough,   non-productive cough,  No- coughing up of blood.              No-   change in color of mucus.   wheezing.   Skin: No-   rash or lesions. GI:  +   heartburn, no-indigestion, abdominal pain, nausea, vomiting,  GU:  MS:  No-   joint pain or swelling.  Neuro-     Per HPI Psych:  No- change in mood or affect. No depression or anxiety.  No memory loss.  OBJ- Physical Exam General- Alert, Oriented, Affect-appropriate, Distress- none acute, + overweight Skin- rash-none, lesions- none, excoriation- none Lymphadenopathy- none Head- atraumatic            Eyes- Gross vision intact, PERRLA, conjunctivae and secretions clear            Ears- grossly normal hearing            Nose- Clear, no-Septal dev, mucus, polyps, erosion, perforation             Throat- Mallampati II-III , mucosa-normal, drainage- none, tonsils- atrophic Neck- flexible , trachea midline, no stridor , thyroid nl, carotid no bruit Chest - symmetrical excursion , unlabored           Heart/CV- RRR , no murmur , no gallop  , no rub, nl s1 s2                           - JVD- none , edema- none, stasis changes- none, varices- none           Lung- clear/unlabored, wheeze- none, cough- none , dullness-none, rub- none           Chest wall-  Abd-  Br/ Gen/ Rectal- Not done, not indicated Extrem- cyanosis- none, clubbing, none, atrophy- none, strength- nl Neuro- grossly intact to observation, calm

## 2016-11-11 NOTE — Assessment & Plan Note (Signed)
He is being followed by neurology for this problem which has not affected his sleep or CPAP use so far.

## 2016-11-11 NOTE — Assessment & Plan Note (Signed)
He continues to benefit from CPAP but is comfortable at pressure 7 with download confirms good compliance and control. No changes are needed.

## 2016-11-11 NOTE — Telephone Encounter (Signed)
Namenda 10 mg BID send to pharmacy.

## 2016-12-07 NOTE — Telephone Encounter (Signed)
Received call back from patient. He was unable to hear VM. This RN repeated earlier message, inquired if he has taken any Tylenol or other OTC. Patient stated he takes Tylenol and the headache goes away. He stated he is only having headache occasionally. This RN advised that if he begins to have frequent headaches and/or headaches not relieved by Tylenol to call back.  Patient verbalized understanding, appreciation.

## 2016-12-07 NOTE — Telephone Encounter (Signed)
Patient called office in reference to memantine (NAMENDA) 10 MG tablet.  Patient states he has been having headaches.  Patient read about this medication causing headaches and would like to know if these are from the medication.  Please call

## 2016-12-07 NOTE — Telephone Encounter (Signed)
LVM advising patient that because he began Namenda mid May it would be expected he would have shown side effects sooner. Also informed him that in a phone note mid June it stated he was not having any side effects from medication. This RN stated that he may take Tylenol unless instructed not to take by a physician. Left number and advised he call back for any  further questions.

## 2016-12-08 ENCOUNTER — Telehealth: Payer: Self-pay | Admitting: Adult Health

## 2016-12-08 NOTE — Telephone Encounter (Signed)
Spoke with patient and advised him the medication he is asking about is in clinical trials, not certain if approved. Advised him per NP that this office would not be prescribing it at this time. Advised his it may be in the future, but not now. He verbalized understanding, appreciation of call back.

## 2016-12-08 NOTE — Telephone Encounter (Signed)
Patient called office to relay information on a medication called Aducanumab thru [facility].  Patient states they did clinical trials and patient it not sure if medication has been approved or not.  But would like to see if it could be beneficial to him. Patient didn't have to much information tried to piece this together best I could.  Please call

## 2017-01-19 DIAGNOSIS — H2513 Age-related nuclear cataract, bilateral: Secondary | ICD-10-CM | POA: Diagnosis not present

## 2017-01-19 DIAGNOSIS — H31009 Unspecified chorioretinal scars, unspecified eye: Secondary | ICD-10-CM | POA: Diagnosis not present

## 2017-01-19 DIAGNOSIS — H43813 Vitreous degeneration, bilateral: Secondary | ICD-10-CM | POA: Diagnosis not present

## 2017-01-19 DIAGNOSIS — H33321 Round hole, right eye: Secondary | ICD-10-CM | POA: Diagnosis not present

## 2017-02-11 DIAGNOSIS — N401 Enlarged prostate with lower urinary tract symptoms: Secondary | ICD-10-CM | POA: Diagnosis not present

## 2017-02-11 DIAGNOSIS — Z6829 Body mass index (BMI) 29.0-29.9, adult: Secondary | ICD-10-CM | POA: Diagnosis not present

## 2017-02-11 DIAGNOSIS — D692 Other nonthrombocytopenic purpura: Secondary | ICD-10-CM | POA: Diagnosis not present

## 2017-02-11 DIAGNOSIS — N3010 Interstitial cystitis (chronic) without hematuria: Secondary | ICD-10-CM | POA: Diagnosis not present

## 2017-02-11 DIAGNOSIS — G4733 Obstructive sleep apnea (adult) (pediatric): Secondary | ICD-10-CM | POA: Diagnosis not present

## 2017-02-11 DIAGNOSIS — E7800 Pure hypercholesterolemia, unspecified: Secondary | ICD-10-CM | POA: Diagnosis not present

## 2017-02-11 DIAGNOSIS — Z23 Encounter for immunization: Secondary | ICD-10-CM | POA: Diagnosis not present

## 2017-02-11 DIAGNOSIS — H9193 Unspecified hearing loss, bilateral: Secondary | ICD-10-CM | POA: Diagnosis not present

## 2017-02-11 DIAGNOSIS — R7302 Impaired glucose tolerance (oral): Secondary | ICD-10-CM | POA: Diagnosis not present

## 2017-02-22 DIAGNOSIS — L578 Other skin changes due to chronic exposure to nonionizing radiation: Secondary | ICD-10-CM | POA: Diagnosis not present

## 2017-02-22 DIAGNOSIS — L304 Erythema intertrigo: Secondary | ICD-10-CM | POA: Diagnosis not present

## 2017-02-22 DIAGNOSIS — L821 Other seborrheic keratosis: Secondary | ICD-10-CM | POA: Diagnosis not present

## 2017-03-05 ENCOUNTER — Other Ambulatory Visit: Payer: Self-pay | Admitting: Neurology

## 2017-03-06 ENCOUNTER — Telehealth: Payer: Self-pay | Admitting: Neurology

## 2017-03-06 NOTE — Telephone Encounter (Signed)
I returned call from patient who stated he has had 3 brief episodes of black out and loss of vision for few seconds over last 3 weeks. No headache or other accompanying focal neurological symptom. He has h/o MCI and brain MRI showing microvascular disease ? Amyloid/ I recommend he call Dr Jaynee Eagles for a appoitment to be seen for this new compalint and also see his eye Dr for good retinal exam.May need ESR . Ct angios in Oct 2016 were unremarkable.

## 2017-03-08 NOTE — Telephone Encounter (Signed)
LMVM for pt that [name] can see tomorrow at 900 or 1030.  Please call us back. Spoke to pt and he will come in tomorrow at 0830 for 900 appt.

## 2017-03-08 NOTE — Telephone Encounter (Signed)
Patient called office in reference to speaking with Dr. Leonie Man over the weekend with new symptoms.  Patient contacted Dr. Dahlia Bailiff office explaining black out/vision loss and they advised patient to contact our office.  Please call.

## 2017-03-09 ENCOUNTER — Encounter: Payer: Self-pay | Admitting: Neurology

## 2017-03-09 ENCOUNTER — Other Ambulatory Visit: Payer: Self-pay | Admitting: *Deleted

## 2017-03-09 ENCOUNTER — Ambulatory Visit (INDEPENDENT_AMBULATORY_CARE_PROVIDER_SITE_OTHER): Payer: PPO | Admitting: Neurology

## 2017-03-09 VITALS — BP 116/68 | HR 72 | Ht 70.0 in | Wt 199.6 lb

## 2017-03-09 DIAGNOSIS — H53133 Sudden visual loss, bilateral: Secondary | ICD-10-CM

## 2017-03-09 DIAGNOSIS — R519 Headache, unspecified: Secondary | ICD-10-CM

## 2017-03-09 DIAGNOSIS — R51 Headache: Secondary | ICD-10-CM | POA: Diagnosis not present

## 2017-03-09 DIAGNOSIS — R410 Disorientation, unspecified: Secondary | ICD-10-CM

## 2017-03-09 DIAGNOSIS — I671 Cerebral aneurysm, nonruptured: Secondary | ICD-10-CM | POA: Diagnosis not present
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
## 2017-03-09 NOTE — Patient Instructions (Signed)
MRI brain and head and neck Labs today

## 2017-03-10 ENCOUNTER — Telehealth: Payer: Self-pay | Admitting: *Deleted

## 2017-03-10 LAB — CBC
HEMATOCRIT: 48.5 % (ref 37.5–51.0)
HEMOGLOBIN: 15.9 g/dL (ref 13.0–17.7)
MCH: 30.5 pg (ref 26.6–33.0)
MCHC: 32.8 g/dL (ref 31.5–35.7)
MCV: 93 fL (ref 79–97)
Platelets: 152 10*3/uL (ref 150–379)
RBC: 5.22 x10E6/uL (ref 4.14–5.80)
RDW: 14.9 % (ref 12.3–15.4)
WBC: 6.3 10*3/uL (ref 3.4–10.8)

## 2017-03-10 LAB — COMPREHENSIVE METABOLIC PANEL
ALK PHOS: 64 IU/L (ref 39–117)
ALT: 17 IU/L (ref 0–44)
AST: 18 IU/L (ref 0–40)
Albumin/Globulin Ratio: 2 (ref 1.2–2.2)
Albumin: 4.7 g/dL (ref 3.5–4.8)
BILIRUBIN TOTAL: 0.4 mg/dL (ref 0.0–1.2)
BUN/Creatinine Ratio: 19 (ref 10–24)
BUN: 22 mg/dL (ref 8–27)
CHLORIDE: 105 mmol/L (ref 96–106)
CO2: 26 mmol/L (ref 20–29)
Calcium: 9.7 mg/dL (ref 8.6–10.2)
Creatinine, Ser: 1.17 mg/dL (ref 0.76–1.27)
GFR calc non Af Amer: 59 mL/min/{1.73_m2} — ABNORMAL LOW (ref >59)
GFR, EST AFRICAN AMERICAN: 68 mL/min/{1.73_m2} (ref >59)
GLUCOSE: 105 mg/dL — AB (ref 65–99)
Globulin, Total: 2.4 g/dL (ref 1.5–4.5)
Potassium: 4 mmol/L (ref 3.5–5.2)
Sodium: 145 mmol/L — ABNORMAL HIGH (ref 134–144)
TOTAL PROTEIN: 7.1 g/dL (ref 6.0–8.5)

## 2017-03-10 LAB — URINALYSIS, ROUTINE W REFLEX MICROSCOPIC
BILIRUBIN UA: NEGATIVE
Glucose, UA: NEGATIVE
Ketones, UA: NEGATIVE
Leukocytes, UA: NEGATIVE
Nitrite, UA: NEGATIVE
PH UA: 5 (ref 5.0–7.5)
Protein, UA: NEGATIVE
RBC UA: NEGATIVE
Specific Gravity, UA: 1.024 (ref 1.005–1.030)
UUROB: 0.2 mg/dL (ref 0.2–1.0)

## 2017-03-10 LAB — C-REACTIVE PROTEIN: CRP: 1.1 mg/L (ref 0.0–4.9)

## 2017-03-10 LAB — SEDIMENTATION RATE: Sed Rate: 4 mm/hr (ref 0–30)

## 2017-03-10 NOTE — Telephone Encounter (Signed)
Called patient, he is aware of unremarkable labs. He had no questions.

## 2017-03-10 NOTE — Telephone Encounter (Signed)
-----   Message from Melvenia Beam, MD sent at 03/10/2017 10:34 AM EDT ----- Labs unremarkable thanks

## 2017-03-21 ENCOUNTER — Other Ambulatory Visit: Payer: PPO

## 2017-03-29 ENCOUNTER — Other Ambulatory Visit: Payer: PPO

## 2017-03-29 ENCOUNTER — Ambulatory Visit
Admission: RE | Admit: 2017-03-29 | Discharge: 2017-03-29 | Disposition: A | Discharge status: Home / Self Care | Payer: PPO | Source: Ambulatory Visit | Attending: Neurology | Admitting: Neurology

## 2017-03-29 DIAGNOSIS — H53133 Sudden visual loss, bilateral: Secondary | ICD-10-CM

## 2017-03-29 DIAGNOSIS — R410 Disorientation, unspecified: Secondary | ICD-10-CM | POA: Diagnosis not present

## 2017-03-29 DIAGNOSIS — R51 Headache: Secondary | ICD-10-CM

## 2017-03-29 DIAGNOSIS — R519 Headache, unspecified: Secondary | ICD-10-CM

## 2017-03-29 DIAGNOSIS — I671 Cerebral aneurysm, nonruptured: Secondary | ICD-10-CM

## 2017-03-29 DIAGNOSIS — R413 Other amnesia: Secondary | ICD-10-CM | POA: Diagnosis not present

## 2017-03-29 MED ORDER — GADOBENATE DIMEGLUMINE 529 MG/ML IV SOLN
20.0000 mL | Freq: Once | INTRAVENOUS | Status: AC | PRN
  Administered 2017-03-29: 20 mL via INTRAVENOUS

## 2017-03-30 ENCOUNTER — Telehealth: Payer: Self-pay | Admitting: *Deleted

## 2017-03-30 NOTE — Telephone Encounter (Signed)
-----   Message from Melvenia Beam, MD sent at 03/30/2017  1:30 PM EDT ----- MRA head unremarkable

## 2017-03-30 NOTE — Telephone Encounter (Signed)
Called and left detailed VM on pt's home phone (ok per DPR) and informed him of following results: MRA head unremarkable, MRA neck unremarkable, and MRI brain no significant changes from MRI brain on 04/08/2016. I left the office number and told him that he can call us back if he has any questions.

## 2017-04-01 DIAGNOSIS — F0280 Dementia in other diseases classified elsewhere without behavioral disturbance: Secondary | ICD-10-CM | POA: Diagnosis not present

## 2017-04-01 DIAGNOSIS — Z9989 Dependence on other enabling machines and devices: Secondary | ICD-10-CM | POA: Diagnosis not present

## 2017-04-01 DIAGNOSIS — E854 Organ-limited amyloidosis: Secondary | ICD-10-CM | POA: Diagnosis not present

## 2017-04-01 DIAGNOSIS — G4733 Obstructive sleep apnea (adult) (pediatric): Secondary | ICD-10-CM | POA: Diagnosis not present

## 2017-04-01 DIAGNOSIS — I680 Cerebral amyloid angiopathy: Secondary | ICD-10-CM | POA: Diagnosis not present

## 2017-04-01 DIAGNOSIS — G301 Alzheimer's disease with late onset: Secondary | ICD-10-CM | POA: Diagnosis not present

## 2017-04-05 DIAGNOSIS — G4733 Obstructive sleep apnea (adult) (pediatric): Secondary | ICD-10-CM | POA: Diagnosis not present

## 2017-04-12 ENCOUNTER — Encounter: Payer: Self-pay | Admitting: Neurology

## 2017-04-12 ENCOUNTER — Encounter (INDEPENDENT_AMBULATORY_CARE_PROVIDER_SITE_OTHER): Payer: Self-pay

## 2017-04-12 ENCOUNTER — Ambulatory Visit (INDEPENDENT_AMBULATORY_CARE_PROVIDER_SITE_OTHER): Payer: PPO | Admitting: Neurology

## 2017-04-12 ENCOUNTER — Encounter: Payer: Self-pay | Admitting: Psychology

## 2017-04-12 VITALS — BP 136/77 | HR 70 | Ht 70.0 in | Wt 205.2 lb

## 2017-04-12 DIAGNOSIS — G3184 Mild cognitive impairment, so stated: Secondary | ICD-10-CM

## 2017-04-12 DIAGNOSIS — R413 Other amnesia: Secondary | ICD-10-CM
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
## 2017-04-12 NOTE — Patient Instructions (Signed)
Formal Neurocognitive testing

## 2017-05-03 DIAGNOSIS — Z87442 Personal history of urinary calculi: Secondary | ICD-10-CM | POA: Diagnosis not present

## 2017-05-03 DIAGNOSIS — R350 Frequency of micturition: Secondary | ICD-10-CM | POA: Diagnosis not present

## 2017-05-03 DIAGNOSIS — N401 Enlarged prostate with lower urinary tract symptoms: Secondary | ICD-10-CM | POA: Diagnosis not present

## 2017-05-03 DIAGNOSIS — Z8701 Personal history of pneumonia (recurrent): Secondary | ICD-10-CM | POA: Diagnosis not present

## 2017-05-07 ENCOUNTER — Other Ambulatory Visit: Payer: Self-pay | Admitting: Adult Health

## 2017-05-13 DIAGNOSIS — G453 Amaurosis fugax: Secondary | ICD-10-CM | POA: Diagnosis not present

## 2017-05-13 DIAGNOSIS — H43813 Vitreous degeneration, bilateral: Secondary | ICD-10-CM | POA: Diagnosis not present

## 2017-05-13 DIAGNOSIS — H2513 Age-related nuclear cataract, bilateral: Secondary | ICD-10-CM | POA: Diagnosis not present

## 2017-05-13 DIAGNOSIS — H33321 Round hole, right eye: Secondary | ICD-10-CM | POA: Diagnosis not present

## 2017-05-17 DIAGNOSIS — J019 Acute sinusitis, unspecified: Secondary | ICD-10-CM | POA: Diagnosis not present

## 2017-05-17 DIAGNOSIS — R05 Cough: Secondary | ICD-10-CM | POA: Diagnosis not present

## 2017-05-17 DIAGNOSIS — J209 Acute bronchitis, unspecified: Secondary | ICD-10-CM | POA: Diagnosis not present

## 2017-05-17 DIAGNOSIS — F0390 Unspecified dementia without behavioral disturbance: Secondary | ICD-10-CM | POA: Diagnosis not present

## 2017-05-17 DIAGNOSIS — Z6827 Body mass index (BMI) 27.0-27.9, adult: Secondary | ICD-10-CM | POA: Diagnosis not present

## 2017-06-07 DIAGNOSIS — H40013 Open angle with borderline findings, low risk, bilateral: Secondary | ICD-10-CM | POA: Diagnosis not present

## 2017-06-07 DIAGNOSIS — H43811 Vitreous degeneration, right eye: Secondary | ICD-10-CM | POA: Diagnosis not present

## 2017-06-07 DIAGNOSIS — M316 Other giant cell arteritis: Secondary | ICD-10-CM | POA: Diagnosis not present

## 2017-06-07 DIAGNOSIS — G453 Amaurosis fugax: Secondary | ICD-10-CM | POA: Diagnosis not present

## 2017-06-07 DIAGNOSIS — H2513 Age-related nuclear cataract, bilateral: Secondary | ICD-10-CM | POA: Diagnosis not present

## 2017-06-11 DIAGNOSIS — H40013 Open angle with borderline findings, low risk, bilateral: Secondary | ICD-10-CM | POA: Diagnosis not present

## 2017-06-11 DIAGNOSIS — H43811 Vitreous degeneration, right eye: Secondary | ICD-10-CM | POA: Diagnosis not present

## 2017-06-11 DIAGNOSIS — H2513 Age-related nuclear cataract, bilateral: Secondary | ICD-10-CM | POA: Diagnosis not present

## 2017-07-14 DIAGNOSIS — H2513 Age-related nuclear cataract, bilateral: Secondary | ICD-10-CM | POA: Diagnosis not present

## 2017-07-14 DIAGNOSIS — H40013 Open angle with borderline findings, low risk, bilateral: Secondary | ICD-10-CM | POA: Diagnosis not present

## 2017-07-14 DIAGNOSIS — H18463 Peripheral corneal degeneration, bilateral: Secondary | ICD-10-CM | POA: Diagnosis not present

## 2017-07-14 DIAGNOSIS — H43811 Vitreous degeneration, right eye: Secondary | ICD-10-CM | POA: Diagnosis not present

## 2017-07-22 ENCOUNTER — Ambulatory Visit: Payer: PPO | Admitting: Psychology

## 2017-07-22 ENCOUNTER — Encounter: Payer: Self-pay | Admitting: Psychology

## 2017-07-22 DIAGNOSIS — R413 Other amnesia: Secondary | ICD-10-CM

## 2017-07-22 DIAGNOSIS — G3184 Mild cognitive impairment, so stated: Secondary | ICD-10-CM

## 2017-07-22 NOTE — Progress Notes (Addendum)
   Neuropsychology Note  Dylan [name] completed 90 minutes of neuropsychological testing with technician, Milana Kidney, BS, under the supervision of Dr. Macarthur Critchley, Licensed Psychologist. The patient did not appear overtly distressed by the testing session, per behavioral observation or via self-report to the technician. Rest breaks were offered.   Clinical Decision Making: In considering the patient's current level of functioning, level of presumed impairment, nature of symptoms, emotional and behavioral responses during the interview, level of literacy, and observed level of motivation/effort, a battery of tests was selected and communicated to the psychometrician.  Communication between the psychologist and technician was ongoing throughout the testing session and changes were made as deemed necessary based on patient performance on testing, technician observations and additional pertinent factors such as those listed above.  Dylan [name] will return within approximately 2 weeks for an interactive feedback session with [name] at which time his test performances, clinical impressions and treatment recommendations will be reviewed in detail. The patient understands he can contact our office should he require our assistance before this time.  20 minutes spent performing neuropsychological evaluation services/clinical decision making (psychologist). [CPT 88757] 97 minutes spent face-to-face with patient administering standardized tests, 30 minutes spent scoring (technician). [CPT [id number], 28206]  Full report to follow.
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
## 2017-08-02 DIAGNOSIS — R82998 Other abnormal findings in urine: Secondary | ICD-10-CM | POA: Diagnosis not present

## 2017-08-02 DIAGNOSIS — E7800 Pure hypercholesterolemia, unspecified: Secondary | ICD-10-CM | POA: Diagnosis not present

## 2017-08-02 DIAGNOSIS — Z125 Encounter for screening for malignant neoplasm of prostate: Secondary | ICD-10-CM | POA: Diagnosis not present

## 2017-08-02 DIAGNOSIS — R7302 Impaired glucose tolerance (oral): Secondary | ICD-10-CM | POA: Diagnosis not present

## 2017-08-06 DIAGNOSIS — Z1212 Encounter for screening for malignant neoplasm of rectum: Secondary | ICD-10-CM | POA: Diagnosis not present

## 2017-08-06 LAB — IFOBT (OCCULT BLOOD): IFOBT: POSITIVE

## 2017-08-09 DIAGNOSIS — G4733 Obstructive sleep apnea (adult) (pediatric): Secondary | ICD-10-CM | POA: Diagnosis not present

## 2017-08-09 DIAGNOSIS — R7302 Impaired glucose tolerance (oral): Secondary | ICD-10-CM | POA: Diagnosis not present

## 2017-08-09 DIAGNOSIS — F418 Other specified anxiety disorders: Secondary | ICD-10-CM | POA: Diagnosis not present

## 2017-08-09 DIAGNOSIS — N3010 Interstitial cystitis (chronic) without hematuria: Secondary | ICD-10-CM | POA: Diagnosis not present

## 2017-08-09 DIAGNOSIS — E7800 Pure hypercholesterolemia, unspecified: Secondary | ICD-10-CM | POA: Diagnosis not present

## 2017-08-09 DIAGNOSIS — Z1389 Encounter for screening for other disorder: Secondary | ICD-10-CM | POA: Diagnosis not present

## 2017-08-09 DIAGNOSIS — Z6828 Body mass index (BMI) 28.0-28.9, adult: Secondary | ICD-10-CM | POA: Diagnosis not present

## 2017-08-09 DIAGNOSIS — F0390 Unspecified dementia without behavioral disturbance: Secondary | ICD-10-CM | POA: Diagnosis not present

## 2017-08-09 DIAGNOSIS — Z0000 Encounter for general adult medical examination without abnormal findings: Secondary | ICD-10-CM | POA: Diagnosis not present

## 2017-08-09 DIAGNOSIS — R972 Elevated prostate specific antigen [PSA]: Secondary | ICD-10-CM | POA: Diagnosis not present

## 2017-08-09 DIAGNOSIS — K219 Gastro-esophageal reflux disease without esophagitis: Secondary | ICD-10-CM | POA: Diagnosis not present

## 2017-08-09 DIAGNOSIS — N401 Enlarged prostate with lower urinary tract symptoms: Secondary | ICD-10-CM | POA: Diagnosis not present

## 2017-08-10 ENCOUNTER — Telehealth: Payer: Self-pay

## 2017-08-10 NOTE — Telephone Encounter (Signed)
SENT REFERRAL TO [place] [facility] 3 [phone]

## 2017-08-19 DIAGNOSIS — K921 Melena: Secondary | ICD-10-CM | POA: Diagnosis not present

## 2017-08-29 NOTE — Progress Notes (Signed)
NEUROPSYCHOLOGICAL EVALUATION   Name:    Dylan [name]  Date of Birth:   Apr 05, 1936 Date of Interview:  07/22/2017 Date of Testing:  07/22/2017   Date of Feedback:  08/31/2017       Background Information:  Reason for Referral:  Dylan [name] is a 82 y.o. male referred by Dr. Sarina Ill to assess his current level of cognitive functioning and assist in differential diagnosis. The current evaluation consisted of a review of available medical records, an interview with the patient and his wife, and the completion of a neuropsychological testing battery. Informed consent was obtained.  History of Presenting Problem:  Dylan [name] has been followed by [name] for memory decline since August 2016. According to his wife, memory decline was first noticed about 5 years ago when he had difficulty navigating the way back from [place] which is a frequently traveled route for them as 2 of their daughters live there. He underwent neurocognitive testing by Dr. Valentina Shaggy in April 2017, which indicated multi domain MCI, unclear if AD or vascular etiology. Over the past two years, his wife reports continued mild memory decline but only minimal functional decline. He does forget recent conversations/things he has been told, and he repeats questions especially about upcoming appointments. He has some word finding difficulty, especially when under stress, but he has always been a quiet person. He sometimes has difficulty comprehending other people but not if he is facing them and singly focused on the conversation (ie not doing other tasks at the same time). He remains relatively independent. He did stop driving in recent months, due to increasing confusion when he was driving (he also mentioned brief instances of vision loss when driving but it appears this may have just been confusion). 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 He has hired a firm to help with the day-to-day  tasks but he is still responsible for decision making and is able to do this without any difficulty. He asks his wife if he is unclear about anything (they are partners in the business). He is also able to manage his medications independently; his wife just checks to make sure he has taken them. He manages finances and financial decisions independently with no reported difficulty. He stays quite busy with work and community activities. He does have some stress/anxiety related to his work and more recently related to giving up driving. He has not had any changes in mood or personality. No hallucinations or behavioral problems. He is "even-keel", rarely to never gets upset/irritable, and is pleasant to be around. Psychiatric history is negative. He denies depressed mood or anhedonia. He admits he does not get as much exercise as he should. He eats healthfully. He has no difficulty sleeping. He denies any significant daytime fatigue although he does think his morning dose of Sertraline "slows him down". He faithfully wears his CPAP for OSA. He drinks minimal alcohol.   He has had some falls in the past, seemingly mechanical in nature. Six to 8 months ago, he did fall at night in the bathroom and hit his head on the tub. No LOC. He had some pain in the back of his head off and on afterwards but it has resolved. His wife denied any change in mental status or other cognitive sequelae. He generally does not have any difficulty with walking or balance.   Family history is reportedly significant for dementia in two maternal aunts.  Most recent  brain MRI was completed 03/29/2017 and reportedly revealed the following: 1. T2/FLAIR hyperintense foci in the hemispheres consistent with moderate chronic microvascular ischemic change, unchanged when compared to the 04/08/2016 MRI. 2. Several small chronic microhemorrhages in the hemispheres, unchanged when compared to the previous MRI. As there are only a few  foci, this is nonspecific  3. Mild generalized cortical atrophy, slightly progressed compared to the previous MRI.  Dylan [name] has also seen Dr. Wallene Dales of [facility] on two occasions. The patient is currently taking Namenda and Exelon. He has been tried on Aricept in the past.  He has been prescribed Sertraline for several years by his PCP, and it is reported that this was initially prescribed to treat interstitial cystitis.    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 His wife notes they have about 7 small businesses under one umbrella, and the biggest one is their rental property business.  Marital history: Married x54 years, with 3 daughters, 3 grandchildren Alcohol: Rare/minimal Tobacco: Never a smoker or tobacco user   Medical History:  Past Medical History:  Diagnosis Date  . High cholesterol   . Kidney stone   . Memory loss   . Sleep apnea    NPSG 08-03-07 AHI 10/hr, RDI 19.7/hr    Current medications:  Outpatient Encounter Medications as of 08/31/2017  Medication Sig  . fluorouracil (EFUDEX) 5 % cream 1 APPLICATION APPLY TO THE SCALP NIGHTLY APPLY TO SCALP NIGHTLY FOR14 DAYS  . ketoconazole (NIZORAL) 2 % cream Apply 1 application 2 (two) times daily topically.  . memantine (NAMENDA) 10 MG tablet TAKE 1 TABLET BY MOUTH TWICE A DAY  . pentosan polysulfate (ELMIRON) 100 MG capsule Take 100 mg by mouth as needed. One  times daily  . potassium citrate (UROCIT-K) 10 MEQ (1080 MG) SR tablet Take 2 tablets by mouth 2 (two) times daily.   . ranitidine (ZANTAC) 75 MG tablet Take 75 mg by mouth 2 (two) times daily as needed.   . rivastigmine (EXELON) 9.5 mg/24hr Place 9.5 mg daily onto the skin.  Marland Kitchen sertraline (ZOLOFT) 50 MG tablet Take 25 mg by mouth 2 (two) times daily.   . simvastatin (ZOCOR) 80 MG tablet Take 80 mg by mouth at bedtime.      . tamsulosin (FLOMAX) 0.4 MG CAPS capsule Take 0.4 mg by mouth daily.   No facility-administered encounter medications on file as of 08/31/2017.      Current Examination:  Behavioral Observations:  Appearance: Neatly and appropriately dressed and groomed Gait: Ambulated independently, no gross abnormalities observed Speech: Fluent but somewhat sparse. Mild word finding difficulty and pauses during conversational speech. Thought process: Linear, goal directed Affect: Blunted but euthymic, mildly anxious Interpersonal: Pleasant, appropriate Orientation: Oriented to person, place and most aspects of time (one day off on the day of the week). Accurately named the current President and his predecessor.   Tests Administered: . Test of Premorbid Functioning (TOPF) . Wechsler Adult Intelligence Scale-Fourth Edition (WAIS-IV): Similarities, Block Design, Coding, Digit Span and Matrix Reasoning subtests . Wechsler Memory Scale-Fourth Edition (WMS-IV) Older Adult Version (ages 65-90): Logical Memory I, II and Recognition subtests  . [employer] Verbal Learning Test - 2nd Edition (CVLT-2) Short Form . Repeatable Battery for the Assessment of Neuropsychological Status (RBANS) Form A:  Figure Copy and Recall subtests and Semantic Fluency subtest . Boston Naming Test . Boston Diagnostic Aphasia Examination: Complex Ideational Material  subtest . Controlled Oral Word Association Test (COWAT) . Trail Making Test A and B . Clock drawing test . Geriatric Depression Scale (GDS) 15 Item . Generalized Anxiety Disorder - 7 item screener (GAD-7)  Test Results: Note: Standardized scores are presented only for use by appropriately trained professionals and to allow for any future test-retest comparison. These scores should not be interpreted without consideration of all the information that is contained in the rest of the report. The most recent standardization samples from the test publisher or other sources  were used whenever possible to derive standard scores; scores were corrected for age, gender, ethnicity and education when available.   Test Scores:  Test Name Raw Score Standardized Score Descriptor  TOPF 33/70 SS= 95 Average  WAIS-IV Subtests     Similarities 15/36 ss= 7 Low average  Block Design 33/66 ss= 11 Average  Coding 33/135 ss= 8 Average  Digit Span  16/48 ss= 6 Low average  Matrix Reasoning 9/26 ss= 9 Average  WMS-IV Subtests     LM I 8/53 ss= 3 Impaired  LM II 0/39 ss= 1 Severely impaired  LM II Recognition 15/23 Cum %: 17-25 Below expectation  RBANS Subtests     Figure Copy 20/20 Z= 1.2 High average  Figure Recall 8/20 Z= -1.1 Low average  Semantic Fluency 6 Z= -2.7 Severely impaired  CVLT-II Scores     Trial 1 2/9 Z= -3 Severely impaired  Trial 4 3/9 Z= -2.5 Impaired  Trials 1-4 total 10/36 T= 19 Severely impaired  SD Free Recall 1/9 Z= -2 Impaired  LD Free Recall 1/9 Z= -1 Low average  LD Cued Recall 1/9 Z= -2 Impaired  Recognition Discriminability 6/9 hits, 9 false positives Z= -1.5 Borderline  Forced Choice Recognition 9/9  WNL  BNT 56/60 T= 57 High average  BDAE Subtest     Complex Ideational Material 9/12  Mildly impaired  COWAT-FAS 17 T= 29 Impaired  COWAT-Animals 9 T= 28 Impaired  Trail Making Test A  42" 0 errors T= 53 Average  Trail Making Test B  211" 0 errors T= 32 Borderline  Clock Drawing   Mildly impaired  GDS-15 0/15  WNL  GAD-7 1/21  WNL      Description of Test Results:  Premorbid verbal intellectual abilities were estimated to have been within the average range based on a test of word reading. Psychomotor processing speed was average (stable from 2017). Auditory attention and working memory were low average (stable). Visual-spatial construction was average to high average (stable). Language abilities were variable. Specifically, confrontation naming was high average (stable), and semantic verbal fluency was impaired (stable). Auditory  comprehension of complex ideational material was slightly below expectation. With regard to verbal memory, encoding and acquisition of non-contextual information (i.e., word list) was severely impaired (reduced from 2017). After a brief distracter task, free recall was impaired (2/9 items). After a delay, free recall was low average (1/9 items). Cued recall was impaired (1/9 items). Performance on a yes/no recognition task was impaired with a high number of false positive errors (reduced from 2017). On another verbal memory test, encoding and acquisition of contextual auditory information (i.e., short stories) was impaired (reduced from 2017). After a delay, free recall was severely impaired (reduced from 2017). Performance on a yes/no recognition task was below expectation (reduced from 2017). With regard to non-verbal memory, delayed free recall of visual information was low average. Executive functioning was variable. Mental flexibility and set-shifting were borderline impaired on Trails B (  reduced from 2017). Verbal fluency with phonemic search restrictions was impaired (relatively stable). Verbal abstract reasoning was low average (relatively stable). Non-verbal abstract reasoning was average. Performance on a clock drawing task was mildly impaired although he did indicate the correct time. On self-report measures of mood, the patient's responses were not indicative of clinically significant depression or anxiety at the present time.    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 Results of cognitive testing revealed deficits in verbal learning and memory, verbal fluency, and mental flexibility/set-shifting. There was evidence of interval decline in memory encoding, retrieval and consolidation, as well as in mental flexibility/set-shifting, relative to his performances on testing 2 years ago. Additionally, there is evidence that his cognitive  deficits are beginning to interfere with daily functioning and complex ADLs and he has had to stop driving as a result. As such, diagnostic criteria for a dementia syndrome are now met. The patient's cognitive profile is concerning for medial temporal lobe involvement given the prominent impairment in memory encoding/consolidation and semantic fluency. While chronic microvascular ischemic changes are demonstrated on brain MRI, his cognitive profile is not indicative of vascular dementia, and this MRI finding is common in individuals with Alzheimer's disease pathology. Based on clinical features and cognitive testing results (including test-retest comparisons over 2 year span), the most likely etiology of his mild dementia is considered to be Alzheimer's disease. Fortunately there is no evidence of an underlying primary psychiatric disorder or any emotional distress. Further, there is no evidence of behavioral disturbance.    Recommendations/Plan: Based on the findings of the present evaluation, the following recommendations are offered:  1. Continue Namenda and Exelon 2. Continue CPAP 3. Continue healthy nutritional habits 4. Increase physical activity 5. Have regular routine and structure in daily life to maximize cognitive functioning 6. Continue social interaction and mental stimulation 7. 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 Wife should continue to monitor medications and finances to ensure errors are not made secondary to memory deficits 9. Education and support for the patient's wife will likely be beneficial. She was provided with a packet of information that includes local caregiver resources. She may wish to consider attending a local dementia caregiver support group.  10. I agree it is best for him to not return to driving.    Feedback to Patient: Dylan [name] and his wife returned for a feedback appointment on 08/31/2017 to  review the results of his neuropsychological evaluation with this provider. 40 minutes face-to-face time was spent reviewing his test results, my impressions and my recommendations as detailed above.    Total time spent on this patient's case: 75 minutes for neurobehavioral status exam with psychologist (CPT code [phone]); 120 minutes of testing/scoring by psychometrician under psychologist's supervision (CPT codes [phone], [phone] units); 180 minutes for integration of patient data, interpretation of standardized test results and clinical data, clinical decision making, treatment planning and preparation of this report, and interactive feedback with review of results to the patient/family by psychologist (CPT codes [phone], [phone] units).      Thank you for your referral of Dylan [name]. Please feel free to contact me if you have any questions or concerns regarding this report.

## 2017-08-31 ENCOUNTER — Encounter: Payer: Self-pay | Admitting: Psychology

## 2017-08-31 ENCOUNTER — Ambulatory Visit: Payer: PPO | Admitting: Psychology

## 2017-08-31 DIAGNOSIS — F0280 Dementia in other diseases classified elsewhere without behavioral disturbance: Secondary | ICD-10-CM | POA: Diagnosis not present

## 2017-08-31 DIAGNOSIS — G301 Alzheimer's disease with late onset: Secondary | ICD-10-CM

## 2017-08-31 NOTE — Patient Instructions (Addendum)
Results of cognitive testing revealed deficits in verbal learning and memory, verbal fluency, and mental flexibility/set-shifting. There was evidence of interval decline in memory encoding, retrieval and consolidation, as well as in mental flexibility/set-shifting, relative to his performances on testing 2 years ago. At this time, a diagnosis of mild dementia is indicated, and this is most likely due to Alzheimer's disease.   1. Continue Namenda and Exelon 2. Continue CPAP 3. Continue healthy nutritional habits 4. Increase physical activity if possible 5. Have regular routine and structure in daily life to maximize cognitive functioning 6. Continue social interaction and mental stimulation 7. 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 Wife should continue to monitor medications and finances to ensure errors are not made secondary to memory deficits 9. Education and support for the patient's wife will likely be beneficial. She was provided with a packet of information that includes local caregiver resources. She may wish to consider attending a local dementia caregiver support group.  10. I agree it is best for him to not return to driving.

## 2017-09-22 ENCOUNTER — Encounter: Payer: Self-pay | Admitting: Cardiology

## 2017-10-07 ENCOUNTER — Telehealth: Payer: Self-pay | Admitting: Internal Medicine

## 2017-10-07 ENCOUNTER — Encounter: Payer: Self-pay | Admitting: Cardiology

## 2017-10-07 ENCOUNTER — Ambulatory Visit: Payer: PPO | Admitting: Cardiology

## 2017-10-07 VITALS — BP 110/72 | HR 81 | Ht 70.0 in | Wt 197.2 lb

## 2017-10-07 DIAGNOSIS — R55 Syncope and collapse: Secondary | ICD-10-CM

## 2017-10-07 DIAGNOSIS — H53123 Transient visual loss, bilateral: Secondary | ICD-10-CM

## 2017-10-07 DIAGNOSIS — G4733 Obstructive sleep apnea (adult) (pediatric): Secondary | ICD-10-CM

## 2017-10-07 NOTE — Telephone Encounter (Signed)
Left message for patient stating we have taken care of new CPAP request. If there are any further questions or concerns please contact our office. Order placed.

## 2017-10-07 NOTE — Telephone Encounter (Signed)
Spoke with pt, he would like a new CPAP machine. He states his machine stopped working and feels he had his machine for over 5 years. Can we send an order to [facility]? CY please advise.   Current Outpatient Medications on File Prior to Visit  Medication Sig Dispense Refill  . B Complex-C-Folic Acid (SUPER B COMPLEX/FA/VIT C PO) Take 1 tablet by mouth daily.    . fluorouracil (EFUDEX) 5 % cream 1 APPLICATION APPLY TO THE SCALP NIGHTLY APPLY TO SCALP NIGHTLY FOR14 DAYS  0  . ketoconazole (NIZORAL) 2 % cream Apply 1 application 2 (two) times daily topically.    . memantine (NAMENDA) 10 MG tablet TAKE 1 TABLET BY MOUTH TWICE A DAY 60 tablet 5  . pentosan polysulfate (ELMIRON) 100 MG capsule Take 100 mg by mouth as needed. One  times daily    . potassium citrate (UROCIT-K) 10 MEQ (1080 MG) SR tablet Take 2 tablets by mouth 2 (two) times daily.   11  . ranitidine (ZANTAC) 75 MG tablet Take 75 mg by mouth 2 (two) times daily as needed.     . rivastigmine (EXELON) 9.5 mg/24hr Place 9.5 mg daily onto the skin.    Marland Kitchen sertraline (ZOLOFT) 50 MG tablet Take 25 mg by mouth 2 (two) times daily.     . simvastatin (ZOCOR) 80 MG tablet Take 80 mg by mouth at bedtime.      . tamsulosin (FLOMAX) 0.4 MG CAPS capsule Take 0.4 mg by mouth daily.  11   No current facility-administered medications on file prior to visit.    No Known Allergies

## 2017-10-07 NOTE — Progress Notes (Signed)
Cardiology Office Note:    Date:  10/07/2017   ID:  Dylan [name], DOB 1936/05/17, MRN [id number]  PCP:  [name], MD  Cardiologist:  No primary care provider on file.   Referring MD: [name], MD     History of Present Illness:    Dylan [name] is a 82 y.o. male here for cardiac evaluation in the setting of transient blackened vision at the request of Dr. Osborne Casco.  He had sudden vision loss. Was walking and fell, feet came from under him. In Oct. decided not to drive. Darkness was one second while driving. Turned to left instead of correct lane.  He has late onset Alzheimer's, had some difficulty driving in review of neurology's notes, experienced darkened vision transiently for a few seconds.  Hard to quantify.  Denies any chest pain fevers chills nausea vomiting syncope bleeding. No palps.  He is no longer driving.  Creatinine 1.3, sodium 141, hemoglobin 16, LDL 82  He has had MRIs of his brain MRI of neck/MRA as well  Has a history of hyperlipidemia anxiety BPH sleep apnea, IBS  Never smoked.   Mother died in her 85s from blood clot, uncle died at age 14 from MI    Past Medical History:  Diagnosis Date  . High cholesterol   . Kidney stone   . Memory loss   . Sleep apnea    NPSG 08-03-07 AHI 10/hr, RDI 19.7/hr    Past Surgical History:  Procedure Laterality Date  . right clavicle repair  1954   fx  . [place]  . WISDOM TOOTH EXTRACTION  1961    Current Medications: Current Meds  Medication Sig  . B Complex-C-Folic Acid (SUPER B COMPLEX/FA/VIT C PO) Take 1 tablet by mouth daily.  . fluorouracil (EFUDEX) 5 % cream 1 APPLICATION APPLY TO THE SCALP NIGHTLY APPLY TO SCALP NIGHTLY FOR14 DAYS  . ketoconazole (NIZORAL) 2 % cream Apply 1 application 2 (two) times daily topically.  . memantine (NAMENDA) 10 MG tablet TAKE 1 TABLET BY MOUTH TWICE A DAY  . pentosan polysulfate (ELMIRON) 100 MG capsule Take 100 mg by mouth as needed. One  times  daily  . potassium citrate (UROCIT-K) 10 MEQ (1080 MG) SR tablet Take 2 tablets by mouth 2 (two) times daily.   . ranitidine (ZANTAC) 75 MG tablet Take 75 mg by mouth 2 (two) times daily as needed.   . rivastigmine (EXELON) 9.5 mg/24hr Place 9.5 mg daily onto the skin.  Marland Kitchen sertraline (ZOLOFT) 50 MG tablet Take 25 mg by mouth 2 (two) times daily.   . simvastatin (ZOCOR) 80 MG tablet Take 80 mg by mouth at bedtime.    . tamsulosin (FLOMAX) 0.4 MG CAPS capsule Take 0.4 mg by mouth daily.     Allergies:   Patient has no known allergies.   Social History   Socioeconomic History  . Marital status: Married    Spouse name: [name]   . Number of children: 3  . Years of education: 12+  . Highest education level: Not on file  Occupational History  . Occupation: Real Estate-rental properties  Social Needs  . Financial resource strain: Not on file  . Food insecurity:    Worry: Not on file    Inability: Not on file  . Transportation needs:    Medical: Not on file    Non-medical: Not on file  Tobacco Use  . Smoking status: Never Smoker  . Smokeless tobacco: Never  Used  Substance and Sexual Activity  . Alcohol use: Yes    Alcohol/week: 0.6 oz    Types: 1 Glasses of wine per week    Comment: Socially; not weekly use  . Drug use: No  . Sexual activity: Not on file  Lifestyle  . Physical activity:    Days per week: Not on file    Minutes per session: Not on file  . Stress: Not on file  Relationships  . Social connections:    Talks on phone: Not on file    Gets together: Not on file    Attends religious service: Not on file    Active member of club or organization: Not on file    Attends meetings of clubs or organizations: Not on file    Relationship status: Not on file  Other Topics Concern  . Not on file  Social History Narrative   Lives at home with wife, [name].   Caffeine use: 2 cups of tea daily   Right handed     Family History: The patient's family history includes Cancer in  his mother; Heart disease in his father; Pulmonary embolism in his mother; Stroke in his father.  ROS:   Please see the history of present illness.     All other systems reviewed and are negative.  EKGs/Labs/Other Studies Reviewed:    The following studies were reviewed today: Office notes, lab work, prior EKG personally reviewed  EKG: 07/31/2016- sinus rhythm right bundle branch block no other specific abnormalities.  Personally viewed.  Recent Labs: 03/09/2017: ALT 17; BUN 22; Creatinine, Ser 1.17; Hemoglobin 15.9; Platelets 152; Potassium 4.0; Sodium 145  Recent Lipid Panel No results found for: CHOL, TRIG, HDL, CHOLHDL, VLDL, LDLCALC, LDLDIRECT  Physical Exam:    VS:  BP 110/72   Pulse 81   Ht 5\' 10"  (1.778 m)   Wt 197 lb 3.2 oz (89.4 kg)   SpO2 97%   BMI 28.30 kg/m     Wt Readings from Last 3 Encounters:  10/07/17 197 lb 3.2 oz (89.4 kg)  04/12/17 205 lb 3.2 oz (93.1 kg)  03/09/17 199 lb 9.6 oz (90.5 kg)     GEN:  Well nourished, well developed in no acute distress HEENT: Normal NECK: No JVD; No carotid bruits LYMPHATICS: No lymphadenopathy CARDIAC: RRR, no murmurs, rubs, gallops RESPIRATORY:  Clear to auscultation without rales, wheezing or rhonchi  ABDOMEN: Soft, non-tender, non-distended MUSCULOSKELETAL:  No edema; No deformity  SKIN: Warm and dry NEUROLOGIC:  Alert and oriented x 3 PSYCHIATRIC:  Normal affect   ASSESSMENT:    1. Near syncope   2. Transient blindness of both eyes    PLAN:    In order of problems listed above:  Transient loss of vision - We will try to make sure that he has not had any near syncope episodes.  He does not take any antihypertensives.  His blood pressure is 110/72 at baseline today.  I did encourage hydration.  Flomax as a side effect sometimes can cause orthostasis although this transient loss of vision occurred while driving.  I stated that he should not drive.  Back in October he stopped driving.  Recommended 30-day event  monitor to ensure that he is not have any adverse arrhythmias such as significant pauses or ventricular arrhythmias.  He does not remember having any palpitations.  This transient loss of vision only lasted 1 second.  MRA and MRI of brain are fairly unremarkable.  Continue with simvastatin for  prevention efforts.  He does have mild plaque in his carotids but this should not be hemodynamically significant.  We will check an echocardiogram to ensure proper structure and function of his heart.  If all of this comes back negative, this will be reassuring from a heart perspective.  I would not have a good answer for his transient bilateral loss of vision.  This was clearly not amaurosis fugax.  Carotids mild plaque as above.  Only plausible thought would be transient drop in blood pressure.  He knows to be careful at night when getting up out of bed.  30-day event monitor, echocardiogram to ensure proper structure and function of his heart   Medication Adjustments/Labs and Tests Ordered: Current medicines are reviewed at length with the patient today.  Concerns regarding medicines are outlined above.  Orders Placed This Encounter  Procedures  . Cardiac event monitor  . ECHOCARDIOGRAM COMPLETE   No orders of the defined types were placed in this encounter.   Patient Instructions  Medication Instructions:  The current medical regimen is effective;  continue present plan and medications.  Testing/Procedures: Your physician has requested that you have an echocardiogram. Echocardiography is a painless test that uses sound waves to create images of your heart. It provides your doctor with information about the size and shape of your heart and how well your heart's chambers and valves are working. This procedure takes approximately one hour. There are no restrictions for this procedure.  Your physician has recommended that you wear an event monitor for 30 days. Event monitors are medical devices that  record the heart's electrical activity. Doctors most often [place] these monitors to diagnose arrhythmias. Arrhythmias are problems with the speed or rhythm of the heartbeat. The monitor is a small, portable device. You can wear one while you do your normal daily activities. This is usually used to diagnose what is causing palpitations/syncope (passing out).  Follow-Up: Follow up will be based on the results of the above testing.  Thank you for choosing [facility]!!        Signed, Candee Furbish, MD  10/07/2017 9:37 AM    [place]

## 2017-10-07 NOTE — Patient Instructions (Signed)
Medication Instructions:  The current medical regimen is effective;  continue present plan and medications.  Testing/Procedures: Your physician has requested that you have an echocardiogram. Echocardiography is a painless test that uses sound waves to create images of your heart. It provides your doctor with information about the size and shape of your heart and how well your heart's chambers and valves are working. This procedure takes approximately one hour. There are no restrictions for this procedure.  Your physician has recommended that you wear an event monitor for 30 days. Event monitors are medical devices that record the heart's electrical activity. Doctors most often [place] these monitors to diagnose arrhythmias. Arrhythmias are problems with the speed or rhythm of the heartbeat. The monitor is a small, portable device. You can wear one while you do your normal daily activities. This is usually used to diagnose what is causing palpitations/syncope (passing out).  Follow-Up: Follow up will be based on the results of the above testing.  Thank you for choosing [facility]!!

## 2017-10-07 NOTE — Telephone Encounter (Signed)
Yes- DME please replace old CPAP machine, change to auto 5-15, mask of choice, humidifier, supplies, AirView

## 2017-10-08 ENCOUNTER — Telehealth: Payer: Self-pay | Admitting: Internal Medicine

## 2017-10-08 NOTE — Telephone Encounter (Signed)
Spoke with pt and advised him that he would have to come in for a face to face. He states he has an appt with [facility] today at 1:00 pm. I explained to him that he would still need an ov at our office. Pt didn't seem to understand.   I called Barbaraann Rondo at [facility] and advised him to explain that the appt he has there is not the same thing that his insurance requires. Barbaraann Rondo states he would reach out to [facility] to advise pt when he come in for his troubleshooting appt for  his CPAP. Nothing further is needed.

## 2017-10-08 NOTE — Telephone Encounter (Signed)
Pt is needing OV for CPAP.  Per pt, humidifier is not working on current CPAP. Current machine is at least 80 years old and pt could possibly qualify for a new machine but needs OV first before this could be taken care of.  [facility]'s first avail is not until 12/31/17.  Dr. Annamaria Boots, please advise if you are fine with pt seeing a NP so he can have an appt sooner to have his needs addressed.  Thanks!

## 2017-10-09 NOTE — Telephone Encounter (Signed)
Ok to see NP 

## 2017-10-11 ENCOUNTER — Ambulatory Visit (INDEPENDENT_AMBULATORY_CARE_PROVIDER_SITE_OTHER): Payer: PPO | Admitting: Neurology

## 2017-10-11 ENCOUNTER — Encounter: Payer: Self-pay | Admitting: Neurology

## 2017-10-11 ENCOUNTER — Telehealth: Payer: Self-pay | Admitting: *Deleted

## 2017-10-11 VITALS — BP 108/70 | HR 76 | Ht 70.0 in | Wt 196.0 lb

## 2017-10-11 DIAGNOSIS — G309 Alzheimer's disease, unspecified: Secondary | ICD-10-CM

## 2017-10-11 DIAGNOSIS — G301 Alzheimer's disease with late onset: Secondary | ICD-10-CM

## 2017-10-11 DIAGNOSIS — F0280 Dementia in other diseases classified elsewhere without behavioral disturbance: Secondary | ICD-10-CM

## 2017-10-11 MED ORDER — RIVASTIGMINE 13.3 MG/24HR TD PT24: 13.3000 mg | MEDICATED_PATCH | Freq: Every day | TRANSDERMAL | 11 refills | Status: DC

## 2017-10-11 NOTE — Telephone Encounter (Signed)
lmtcb for pt. Pt just needs to be scheduled for an ov with a NP for cpap care.

## 2017-10-11 NOTE — Telephone Encounter (Signed)
At patient's office visit on 10/11/2017 patient stated that he takes half of a Zoloft 50 mg tablet twice per day which is current in medication list. However per outside pharmacy source, Zoloft 100 mg tablet has been filled on 10/05/17. Patient and his wife denied any knowledge of the increased dose and reported to have been taking the medication the same way, half tablet twice daily. RN called pharmacy and confirmed that patient has picked up the 100 mg tablet with instructions to take 1 tablet twice daily on 09/07/17 and 10/05/17. Patient's wife very appreciative and will call Dr. Loren Racer office to discuss what patient has been taking and see if any changes need to be made since he has been sleepier lately.
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
## 2017-10-11 NOTE — Telephone Encounter (Signed)
Patient scheduled with Tammy P. 10/12/17 @ 11:30am -pr

## 2017-10-11 NOTE — Patient Instructions (Signed)
Alzheimer Disease Caregiver Guide A person who has Alzheimer disease may not be able to take care of himself or herself. He or she may need help with simple tasks. The tips below can help you care for the person. Memory loss and confusion If the person is confused or cannot remember things:  Stay calm.  Respond with a short answer.  Avoid correcting him or her in a way that sounds like scolding.  Try not to take it personally, even if he or she forgets your name.  Behavior changes The person may go through behavior changes. This can include depression, anxiety, anger, or seeing things that are not there. When behavior changes:  Try not to take behavior changes personally.  Stay calm and patient.  Do not argue or try to convince the person about a specific point.  Know that these changes are part of the disease process. Try to work through it.  Tips to lessen frustration  Make appointments and do daily tasks when the person is at his or her best.  Take your time. Simple tasks may take longer. Allow plenty of time to complete tasks.  Limit choices for the person.  Involve the person in what you are doing.  Stick to a routine.  Avoid new or crowded places, if possible.  Use simple words, short sentences, and a calm voice. Only give 1 direction at a time.  Buy clothes and shoes that are easy to put on and take off.  Let people help if they offer. Home safety  Keep floors clear. Remove rugs, magazine racks, and floor lamps.  Keep hallways well lit.  Put a handrail and nonslip mat in the bathtub or shower.  Put childproof locks on cabinets that have dangerous items in them. These items include medicine, alcohol, guns, toxic cleaning items, sharp tools, matches, or lighters.  Place locks on doors where the person cannot see or reach them. This helps the person to not wander out of the house and get lost.  Be prepared for emergencies. Keep a list of emergency phone  numbers and addresses in a handy area. Plans for the future  Talk about finances. ? Talk about money management. People with Alzheimer disease have trouble managing their money as the disease gets worse. ? Get help from professional advisors about financial and legal matters.  Talk about future care. ? Choose a power of attorney. This is someone who can make decisions for the person with Alzheimer disease when he or she can no longer do so. ? Talk about driving and when it is the right time to stop. The person's doctor can help with this. ? If the person lives alone, make sure he or she is safe. Some people need extra help at home. Other people need more care at a nursing home or care center. Support groups Some benefits of joining a support group include:  Learning ways to manage stress.  Sharing experiences with others.  Getting emotional comfort and support.  Learning new caregiving skills as the disease progresses.  Knowing what community resources are available and taking advantage of them.  Get help if:  The person has a fever.  The person has a sudden behavior change that does not get better with calming strategies.  The person is unable to manage his or her living situation.  The person threatens you or anyone else, including himself or herself.  You are no longer able to care for the person. This information is not   intended to replace advice given to you by your health care provider. Make sure you discuss any questions you have with your health care provider. Document Released: 08/10/2011 Document Revised: 10/24/2015 Document Reviewed: 07/08/2011 Elsevier Interactive Patient Education  2017 [place] Alzheimer disease is a brain disease that affects memory, thinking, and behavior. People with Alzheimer disease lose mental abilities, and the disease gets worse over time. Survival with Alzheimer disease ranges from several years to as  long as 20 years. What are the causes? This condition develops when a protein called beta-amyloid forms deposits in the brain. It is not known what causes these deposits to form. What increases the risk? This condition is more likely to develop in people who:  Are elderly.  Have a family history of dementia.  Have had a brain injury.  Have heart or blood vessel disease.  Have had a stroke.  Have high blood pressure or high cholesterol.  Have diabetes.  What are the signs or symptoms? Symptoms of this condition happen in three stages, which often overlap. Early stage In this stage, you may continue to be independent. You may still be able to drive, work, and be social. Symptoms in this stage include:  Minor memory problems, such as forgetting a name or what you read.  Difficulty with: ? Paying attention. ? Communicating. ? Doing familiar tasks. ? Learning new things.  Needing more time to do daily activities.  Anxiety.  Social withdrawal.  Loss of motivation.  Moderate stage In this stage, you will start to need care. This stage usually lasts the longest. Symptoms in this stage include:  Difficulty with expressing thoughts.  Memory loss that affects daily life. This can include forgetting: ? Your address or phone number. ? Events that have happened. ? Parts of your personal history, like where you went to school.  Confusion about where you are or what time it is.  Difficulty in judging distance.  Changes in personality, mood, and behavior. You may be moody, irritable, angry, frustrated, fearful, anxious, or suspicious.  Poor reasoning and judgment.  Delusions or hallucinations.  Changes in sleep patterns.  Wandering and getting lost.  Severe stage In the final stage, you will need help with your personal care and dailyactivities. Symptoms in this stage include:  Worsening memory loss.  Personality changes.  Loss of awareness of your  surroundings.  Changes in physical abilities, including the ability to walk, sit, and swallow.  Difficulty in communicating.  Inability to control the bladder and bowels.  Increasing confusion.  Increasing disruptive behavior.  How is this diagnosed? This condition is diagnosed with an assessment by your health care provider. During this assessment, your health care provider will talk with you and your family, friends, or caregivers about your symptoms. A thorough medical history will be taken, and you will have a physical exam and tests. Tests may include:  Lab tests, such as blood or urine tests.  Imaging tests, such as a CT scan, PET scan, or MRI.  A lumbar puncture. This test involves removing and testing a small amount of the fluid that surrounds the brain and spinal cord.  An electroencephalogram (EEG). In this test, small metal discs are used to measure electrical activity in the brain.  Memory tests, cognitive tests, and neuropsychological tests. These tests evaluate brain function.  How is this treated? At this time, there is no treatment to cure Alzheimer disease or stop it from getting worse. The  goals of treatment are:  To slow down the disease.  To manage behavioral problems.  To provide you with a safe environment.  To make life easier for you and your caregivers.  The following treatment options are available:  Medicines. Medicines may help to slow down memory loss and control behavioral symptoms.  Talk therapy. Talk therapy provides you with education, support, and memory aids. It is most helpful in the early stages of the condition.  Counseling or spiritual guidance. It is normal to have a lot of feelings, including anger, relief, fear, and isolation. Counseling and guidance can help you deal with these feelings.  Caregiving. This involves having caregivers help you with your daily activities. Caregivers may be family members, friends, or trained medical  professionals. Caregiving can be done at home or outside the home.  Family support groups. These provide education, emotional support, and information about community resources to family members who are taking care of you.  Follow these instructions at home: Medicines  Take over-the-counter and prescription medicines only as told by your health care provider.  Avoid taking medicines that can affect thinking, such as pain or sleeping medicines. Lifestyle   Make healthy lifestyle choices: ? Be physically active as told by your health care provider. ? Do not use any tobacco products, such as cigarettes, chewing tobacco, and e-cigarettes. If you need help quitting, ask your health care provider. ? Eat a healthy diet. ? Practice stress-management techniques when you get stressed. ? Stay social.  Drink enough fluid to keep your urine clear or pale yellow.  Make sure to get quality sleep. These tips can help you get a good night's rest: ? Avoid napping during the day. ? Keep your sleeping area dark and cool. ? Avoid exercising during the few hours before you go to bed. ? Avoid caffeine products in the evening. General instructions  Work with your health care provider to determine what you need help with and what your safety needs are.  If you were given a bracelet that tracks your location, make sure to wear it.  Keep all follow-up visits as told by your health care provider. This is important.  If you have questions or would like additional support, you may contact The [employer]: ? 24-hour helpline: 925-568-8931 ? Website: [employer] Contact a health care provider if:  You have nausea, vomiting, or trouble with eating.  You have dizziness, or weakness.  You have new or worsening trouble with sleeping.  You or your family members become concerned for your safety. Get help right away if:  You develop chest pain or difficulty with breathing.  You pass  out. This information is not intended to replace advice given to you by your health care provider. Make sure you discuss any questions you have with your health care provider. Document Released: 01/28/2004 Document Revised: 01/17/2016 Document Reviewed: 02/13/2015 Elsevier Interactive Patient Education  [employer].

## 2017-10-12 ENCOUNTER — Ambulatory Visit: Payer: PPO | Admitting: Adult Health

## 2017-10-12 ENCOUNTER — Encounter: Payer: Self-pay | Admitting: Adult Health

## 2017-10-12 VITALS — BP 110/70 | HR 65 | Ht 70.0 in | Wt 196.6 lb

## 2017-10-12 DIAGNOSIS — G4733 Obstructive sleep apnea (adult) (pediatric): Secondary | ICD-10-CM

## 2017-10-12 NOTE — Assessment & Plan Note (Signed)
Well controlled on CPAP At bedtime    Plan  Patient Instructions  Continue on CPAP at bedtime Keep up the good work Work on healthy weight Do not drive if  sleepy Order for new CPAP machine Follow up with Dr. Elsworth Soho  In 1 year and As needed

## 2017-10-12 NOTE — Patient Instructions (Addendum)
Continue on CPAP at bedtime Keep up the good work Work on [employer] Do not drive if  sleepy Order for new CPAP machine Follow up with Dr. Elsworth Soho  In 1 year and As needed

## 2017-10-12 NOTE — Progress Notes (Signed)
@Patient  ID: Dylan [name], male    DOB: 23-Oct-1935, 82 y.o.   MRN: [id number]  Chief Complaint  Patient presents with  . Follow-up    OSA     Referring provider: [name], MD  HPI: 82 year old male followed for obstructive sleep apnea    10/12/2017 Follow up : OSA  Patient returns for a one-year follow-up.  Patient has underlying sleep apnea is on CPAP at bedtime.  Patient says he is doing well on CPAP he feels rested with no significant daytime sleepiness.  Patient says he uses CPAP every night and never misses a night.  Patient's machine recently broke and the humidifier is not working.  Patient is on CPAP 7 cm H2O.  Download shows excellent compliance with average usage at 8 hours.  AHI 0.3.  Minimum leaks.   No Known Allergies  Immunization History  Administered Date(s) Administered  . Influenza Split 03/02/2011, 06/01/2013  . Influenza,inj,Quad PF,6+ Mos 03/01/2014    Past Medical History:  Diagnosis Date  . High cholesterol   . Kidney stone   . Memory loss   . Sleep apnea    NPSG 08-03-07 AHI 10/hr, RDI 19.7/hr    Tobacco History: Social History   Tobacco Use  Smoking Status Never Smoker  Smokeless Tobacco Never Used   Counseling given: Not Answered   Outpatient Encounter Medications as of 10/12/2017  Medication Sig  . B Complex-C-Folic Acid (SUPER B COMPLEX/FA/VIT C PO) Take 1 tablet by mouth daily.  . fluorouracil (EFUDEX) 5 % cream 1 APPLICATION APPLY TO THE SCALP NIGHTLY APPLY TO SCALP NIGHTLY FOR14 DAYS  . ketoconazole (NIZORAL) 2 % cream Apply 1 application 2 (two) times daily topically.  . memantine (NAMENDA) 10 MG tablet TAKE 1 TABLET BY MOUTH TWICE A DAY  . pentosan polysulfate (ELMIRON) 100 MG capsule Take 100 mg by mouth as needed. One  times daily  . potassium citrate (UROCIT-K) 10 MEQ (1080 MG) SR tablet Take 2 tablets by mouth 2 (two) times daily.   . ranitidine (ZANTAC) 75 MG tablet Take 75 mg by mouth 2 (two) times daily as  needed.   . rivastigmine (EXELON) 13.3 MG/24HR Place 1 patch (13.3 mg total) onto the skin daily.  . sertraline (ZOLOFT) 50 MG tablet Take 100 mg by mouth 2 (two) times daily.   . simvastatin (ZOCOR) 80 MG tablet Take 80 mg by mouth at bedtime.    . tamsulosin (FLOMAX) 0.4 MG CAPS capsule Take 0.4 mg by mouth daily.   No facility-administered encounter medications on file as of 10/12/2017.      Review of Systems  Constitutional:   No  weight loss, night sweats,  Fevers, chills, fatigue, or  lassitude.  HEENT:   No headaches,  Difficulty swallowing,  Tooth/dental problems, or  Sore throat,                No sneezing, itching, ear ache, nasal congestion, post nasal drip,   CV:  No chest pain,  Orthopnea, PND, swelling in lower extremities, anasarca, dizziness, palpitations, syncope.   GI  No heartburn, indigestion, abdominal pain, nausea, vomiting, diarrhea, change in bowel habits, loss of appetite, bloody stools.   Resp: No shortness of breath with exertion or at rest.  No excess mucus, no productive cough,  No non-productive cough,  No coughing up of blood.  No change in color of mucus.  No wheezing.  No chest wall deformity  Skin: no rash or lesions.  GU: no  dysuria, change in color of urine, no urgency or frequency.  No flank pain, no hematuria   MS:  No joint pain or swelling.  No decreased range of motion.  No back pain.    Physical Exam  BP 110/70 (BP Location: Left Arm, Cuff Size: Normal)   Pulse 65   Ht 5\' 10"  (1.778 m)   Wt 196 lb 9.6 oz (89.2 kg)   SpO2 97%   BMI 28.21 kg/m   GEN: A/Ox3; pleasant , NAD, well nourished    HEENT:  [place]/AT,  EACs-clear, TMs-wnl, NOSE-clear, THROAT-clear, no lesions, no postnasal drip or exudate noted. Class 3 MP airway   NECK:  Supple w/ fair ROM; no JVD; normal carotid impulses w/o bruits; no thyromegaly or nodules palpated; no lymphadenopathy.    RESP  Clear  P & A; w/o, wheezes/ rales/ or rhonchi. no accessory muscle use, no  dullness to percussion  CARD:  RRR, no m/r/g, no peripheral edema, pulses intact, no cyanosis or clubbing.  GI:   Soft & nt; nml bowel sounds; no organomegaly or masses detected.   Musco: Warm bil, no deformities or joint swelling noted.   Neuro: alert, no focal deficits noted.    Skin: Warm, no lesions or rashes    Lab Results:  CBC   BNP No results found for: BNP  ProBNP No results found for: PROBNP  Imaging: No results found.   Assessment & Plan:   Obstructive sleep apnea Well controlled on CPAP At bedtime    Plan  Patient Instructions  Continue on CPAP at bedtime Keep up the good work Work on healthy weight Do not drive if  sleepy Order for new CPAP machine Follow up with Dr. Elsworth Soho  In 1 year and As needed           Rexene Edison, NP 10/12/2017

## 2017-10-13 DIAGNOSIS — R195 Other fecal abnormalities: Secondary | ICD-10-CM | POA: Diagnosis not present

## 2017-10-13 DIAGNOSIS — K5730 Diverticulosis of large intestine without perforation or abscess without bleeding: Secondary | ICD-10-CM | POA: Diagnosis not present

## 2017-10-13 DIAGNOSIS — D126 Benign neoplasm of colon, unspecified: Secondary | ICD-10-CM | POA: Diagnosis not present

## 2017-10-15 ENCOUNTER — Ambulatory Visit (INDEPENDENT_AMBULATORY_CARE_PROVIDER_SITE_OTHER): Payer: PPO

## 2017-10-15 ENCOUNTER — Ambulatory Visit (HOSPITAL_COMMUNITY): Payer: PPO | Attending: Cardiovascular Disease

## 2017-10-15 ENCOUNTER — Other Ambulatory Visit: Payer: Self-pay

## 2017-10-15 DIAGNOSIS — E785 Hyperlipidemia, unspecified: Secondary | ICD-10-CM | POA: Diagnosis not present

## 2017-10-15 DIAGNOSIS — I429 Cardiomyopathy, unspecified: Secondary | ICD-10-CM | POA: Diagnosis not present

## 2017-10-15 DIAGNOSIS — G4733 Obstructive sleep apnea (adult) (pediatric): Secondary | ICD-10-CM | POA: Insufficient documentation

## 2017-10-15 DIAGNOSIS — R55 Syncope and collapse: Secondary | ICD-10-CM | POA: Diagnosis not present

## 2017-10-15 DIAGNOSIS — I5020 Unspecified systolic (congestive) heart failure: Secondary | ICD-10-CM | POA: Diagnosis not present

## 2017-10-18 DIAGNOSIS — G4733 Obstructive sleep apnea (adult) (pediatric): Secondary | ICD-10-CM | POA: Diagnosis not present

## 2017-10-18 DIAGNOSIS — R55 Syncope and collapse: Secondary | ICD-10-CM | POA: Diagnosis not present

## 2017-10-19 DIAGNOSIS — D126 Benign neoplasm of colon, unspecified: Secondary | ICD-10-CM | POA: Diagnosis not present

## 2017-10-22 DIAGNOSIS — G4733 Obstructive sleep apnea (adult) (pediatric): Secondary | ICD-10-CM | POA: Diagnosis not present

## 2017-10-26 ENCOUNTER — Other Ambulatory Visit: Payer: Self-pay | Admitting: Adult Health

## 2017-11-22 DIAGNOSIS — G4733 Obstructive sleep apnea (adult) (pediatric): Secondary | ICD-10-CM | POA: Diagnosis not present

## 2017-11-30 DIAGNOSIS — G4733 Obstructive sleep apnea (adult) (pediatric): Secondary | ICD-10-CM | POA: Diagnosis not present

## 2017-12-22 ENCOUNTER — Encounter: Payer: Self-pay | Admitting: Internal Medicine

## 2017-12-22 DIAGNOSIS — G4733 Obstructive sleep apnea (adult) (pediatric): Secondary | ICD-10-CM | POA: Diagnosis not present

## 2017-12-23 ENCOUNTER — Ambulatory Visit: Payer: PPO | Admitting: Internal Medicine

## 2017-12-23 ENCOUNTER — Encounter: Payer: Self-pay | Admitting: Internal Medicine

## 2017-12-23 DIAGNOSIS — G4733 Obstructive sleep apnea (adult) (pediatric): Secondary | ICD-10-CM

## 2017-12-23 DIAGNOSIS — J301 Allergic rhinitis due to pollen: Secondary | ICD-10-CM | POA: Diagnosis not present

## 2017-12-23 NOTE — Patient Instructions (Addendum)
Order- D[facility]  We can continue CPAP auto 5-15, mask of choice, humidifier, supplies, airView   Please call if we can help

## 2017-12-23 NOTE — Progress Notes (Signed)
Subjective:    Patient ID: Dylan [name], male    DOB: 04-24-1936, 82 y.o.   MRN: [id number]  HPI M Never smoker followed for OSA, Restless Legs, hx allergy.  -----------------------------------------------------------------------------------------  22/33- 82 year old male never smoker followed for OSA, restless legs, history allergic rhinitis, complicated by GERD,  CPAP 7/[facility] 1 yr follow up for OSA. Pt states breathing has been ok since last visit. Pt uses [facility] as DME.  Being followed by neurology for mild cognitive impairment-possibly cerebral amyloid angiopathy, with some memory loss. He says he is very comfortable with CPAP and it continues to work well for him. Download confirms 96% 4 hour compliance with AHI 0.2/hour. He says restless legs are no longer a disturbance.  12/23/2017- 82 year old male never smoker followed for OSA, Restless Legs, history allergic rhinitis, complicated by GERD, Alzheimer's/ cerebral amyloid angiopathy,  CPAP auto 5-15/[facility] -----osa, denies problems, sleeping well Broken CPAP machine was replaced in May and he is now doing very well.  Wife confirms he sleeps much better with CPAP.  Download 93% compliance AHI 0.3/hour.  Very comfortable with AutoPap. Wife reports he is not having significant problems with rhinitis symptoms..  ROS-see HPI    + = positive Constitutional:   No-   weight loss, night sweats, fevers, chills, fatigue, lassitude. HEENT:   No-  headaches, difficulty swallowing, tooth/dental problems, sore throat,       No-  sneezing, itching, ear ache, nasal congestion, post nasal drip,  CV:  No-   chest pain, orthopnea, PND, swelling in lower extremities, anasarca, dizziness, palpitations Resp:-  shortness of breath with exertion or at rest.              No-   productive cough,   non-productive cough,  No- coughing up of blood.              No-   change in color of mucus.   wheezing.   Skin: No-   rash or lesions. GI:  +  heartburn,  no-indigestion, abdominal pain, nausea, vomiting,  GU:  MS:  No-   joint pain or swelling.  Neuro-     Per HPI. + Memory loss is progressive. Psych:  No- change in mood or affect. No depression or anxiety.  No memory loss.  OBJ- Physical Exam General- Alert, Oriented, Affect-appropriate, Distress- none acute, + overweight Skin- rash-none, lesions- none, excoriation- none Lymphadenopathy- none Head- atraumatic            Eyes- Gross vision intact, PERRLA, conjunctivae and secretions clear            Ears- grossly normal hearing            Nose- Clear, no-Septal dev, mucus, polyps, erosion, perforation             Throat- Mallampati II-III , mucosa-normal, drainage- none, tonsils- atrophic Neck- flexible , trachea midline, no stridor , thyroid nl, carotid no bruit Chest - symmetrical excursion , unlabored           Heart/CV- RRR , no murmur , no gallop  , no rub, nl s1 s2                           - JVD- none , edema- none, stasis changes- none, varices- none           Lung- clear/unlabored, wheeze- none, cough- none , dullness-none, rub- none  Chest wall-  Abd-  Br/ Gen/ Rectal- Not done, not indicated Extrem- cyanosis- none, clubbing, none, atrophy- none, strength- nl Neuro- + obviously looking to wife to answer questions

## 2017-12-24 NOTE — Assessment & Plan Note (Signed)
Clearly benefits from CPAP with improved sleep and likes his AutoPap machine. Plan-continue auto 5-15

## 2017-12-24 NOTE — Assessment & Plan Note (Signed)
Mild and seasonal now not requiring intervention currently.

## 2018-01-20 DIAGNOSIS — H31009 Unspecified chorioretinal scars, unspecified eye: Secondary | ICD-10-CM | POA: Diagnosis not present

## 2018-01-20 DIAGNOSIS — H33321 Round hole, right eye: Secondary | ICD-10-CM | POA: Diagnosis not present

## 2018-01-20 DIAGNOSIS — H43813 Vitreous degeneration, bilateral: Secondary | ICD-10-CM | POA: Diagnosis not present

## 2018-01-20 DIAGNOSIS — H2513 Age-related nuclear cataract, bilateral: Secondary | ICD-10-CM | POA: Diagnosis not present

## 2018-01-22 DIAGNOSIS — G4733 Obstructive sleep apnea (adult) (pediatric): Secondary | ICD-10-CM | POA: Diagnosis not present

## 2018-01-24 DIAGNOSIS — H43811 Vitreous degeneration, right eye: Secondary | ICD-10-CM | POA: Diagnosis not present

## 2018-01-24 DIAGNOSIS — H40013 Open angle with borderline findings, low risk, bilateral: Secondary | ICD-10-CM | POA: Diagnosis not present

## 2018-01-24 DIAGNOSIS — H18463 Peripheral corneal degeneration, bilateral: Secondary | ICD-10-CM | POA: Diagnosis not present

## 2018-01-24 DIAGNOSIS — H2512 Age-related nuclear cataract, left eye: Secondary | ICD-10-CM | POA: Diagnosis not present

## 2018-01-24 DIAGNOSIS — H2513 Age-related nuclear cataract, bilateral: Secondary | ICD-10-CM | POA: Diagnosis not present

## 2018-02-03 DIAGNOSIS — H2512 Age-related nuclear cataract, left eye: Secondary | ICD-10-CM | POA: Diagnosis not present

## 2018-02-09 DIAGNOSIS — Z961 Presence of intraocular lens: Secondary | ICD-10-CM | POA: Diagnosis not present

## 2018-02-20 DIAGNOSIS — H2511 Age-related nuclear cataract, right eye: Secondary | ICD-10-CM | POA: Diagnosis not present

## 2018-02-22 DIAGNOSIS — G4733 Obstructive sleep apnea (adult) (pediatric): Secondary | ICD-10-CM | POA: Diagnosis not present

## 2018-02-23 DIAGNOSIS — K219 Gastro-esophageal reflux disease without esophagitis: Secondary | ICD-10-CM | POA: Diagnosis not present

## 2018-02-23 DIAGNOSIS — N3010 Interstitial cystitis (chronic) without hematuria: Secondary | ICD-10-CM | POA: Diagnosis not present

## 2018-02-23 DIAGNOSIS — G4733 Obstructive sleep apnea (adult) (pediatric): Secondary | ICD-10-CM | POA: Diagnosis not present

## 2018-02-23 DIAGNOSIS — N401 Enlarged prostate with lower urinary tract symptoms: Secondary | ICD-10-CM | POA: Diagnosis not present

## 2018-02-23 DIAGNOSIS — Z6828 Body mass index (BMI) 28.0-28.9, adult: Secondary | ICD-10-CM | POA: Diagnosis not present

## 2018-02-23 DIAGNOSIS — F0390 Unspecified dementia without behavioral disturbance: Secondary | ICD-10-CM | POA: Diagnosis not present

## 2018-02-23 DIAGNOSIS — G4762 Sleep related leg cramps: Secondary | ICD-10-CM | POA: Diagnosis not present

## 2018-02-23 DIAGNOSIS — F418 Other specified anxiety disorders: Secondary | ICD-10-CM | POA: Diagnosis not present

## 2018-02-23 DIAGNOSIS — H9193 Unspecified hearing loss, bilateral: Secondary | ICD-10-CM | POA: Diagnosis not present

## 2018-02-23 DIAGNOSIS — R7302 Impaired glucose tolerance (oral): Secondary | ICD-10-CM | POA: Diagnosis not present

## 2018-02-23 DIAGNOSIS — Z23 Encounter for immunization: Secondary | ICD-10-CM | POA: Diagnosis not present

## 2018-02-23 DIAGNOSIS — D692 Other nonthrombocytopenic purpura: Secondary | ICD-10-CM | POA: Diagnosis not present

## 2018-02-23 DIAGNOSIS — E7800 Pure hypercholesterolemia, unspecified: Secondary | ICD-10-CM | POA: Diagnosis not present

## 2018-02-24 DIAGNOSIS — H2511 Age-related nuclear cataract, right eye: Secondary | ICD-10-CM | POA: Diagnosis not present

## 2018-03-24 DIAGNOSIS — G4733 Obstructive sleep apnea (adult) (pediatric): Secondary | ICD-10-CM | POA: Diagnosis not present

## 2018-04-01 DIAGNOSIS — F0280 Dementia in other diseases classified elsewhere without behavioral disturbance: Secondary | ICD-10-CM | POA: Diagnosis not present

## 2018-04-01 DIAGNOSIS — Z9181 History of falling: Secondary | ICD-10-CM | POA: Diagnosis not present

## 2018-04-01 DIAGNOSIS — G301 Alzheimer's disease with late onset: Secondary | ICD-10-CM | POA: Diagnosis not present

## 2018-04-24 DIAGNOSIS — G4733 Obstructive sleep apnea (adult) (pediatric): Secondary | ICD-10-CM | POA: Diagnosis not present

## 2018-05-06 ENCOUNTER — Other Ambulatory Visit: Payer: Self-pay | Admitting: Adult Health

## 2018-05-09 DIAGNOSIS — G4733 Obstructive sleep apnea (adult) (pediatric): Secondary | ICD-10-CM | POA: Diagnosis not present

## 2018-05-24 DIAGNOSIS — G4733 Obstructive sleep apnea (adult) (pediatric): Secondary | ICD-10-CM | POA: Diagnosis not present

## 2018-06-24 DIAGNOSIS — G4733 Obstructive sleep apnea (adult) (pediatric): Secondary | ICD-10-CM | POA: Diagnosis not present

## 2018-07-20 ENCOUNTER — Other Ambulatory Visit: Payer: Self-pay | Admitting: Neurology

## 2018-07-25 DIAGNOSIS — G4733 Obstructive sleep apnea (adult) (pediatric): Secondary | ICD-10-CM | POA: Diagnosis not present

## 2018-08-10 DIAGNOSIS — R7302 Impaired glucose tolerance (oral): Secondary | ICD-10-CM | POA: Diagnosis not present

## 2018-08-10 DIAGNOSIS — R82998 Other abnormal findings in urine: Secondary | ICD-10-CM | POA: Diagnosis not present

## 2018-08-10 DIAGNOSIS — Z125 Encounter for screening for malignant neoplasm of prostate: Secondary | ICD-10-CM | POA: Diagnosis not present

## 2018-08-10 DIAGNOSIS — E7800 Pure hypercholesterolemia, unspecified: Secondary | ICD-10-CM | POA: Diagnosis not present

## 2018-08-15 DIAGNOSIS — N401 Enlarged prostate with lower urinary tract symptoms: Secondary | ICD-10-CM | POA: Diagnosis not present

## 2018-08-16 DIAGNOSIS — N401 Enlarged prostate with lower urinary tract symptoms: Secondary | ICD-10-CM | POA: Diagnosis not present

## 2018-08-16 DIAGNOSIS — N3010 Interstitial cystitis (chronic) without hematuria: Secondary | ICD-10-CM | POA: Diagnosis not present

## 2018-08-16 DIAGNOSIS — Z0000 Encounter for general adult medical examination without abnormal findings: Secondary | ICD-10-CM | POA: Diagnosis not present

## 2018-08-16 DIAGNOSIS — K219 Gastro-esophageal reflux disease without esophagitis: Secondary | ICD-10-CM | POA: Diagnosis not present

## 2018-08-16 DIAGNOSIS — R972 Elevated prostate specific antigen [PSA]: Secondary | ICD-10-CM | POA: Diagnosis not present

## 2018-08-16 DIAGNOSIS — R7302 Impaired glucose tolerance (oral): Secondary | ICD-10-CM | POA: Diagnosis not present

## 2018-08-16 DIAGNOSIS — Z1331 Encounter for screening for depression: Secondary | ICD-10-CM | POA: Diagnosis not present

## 2018-08-16 DIAGNOSIS — F0390 Unspecified dementia without behavioral disturbance: Secondary | ICD-10-CM | POA: Diagnosis not present

## 2018-08-16 DIAGNOSIS — F418 Other specified anxiety disorders: Secondary | ICD-10-CM | POA: Diagnosis not present

## 2018-08-16 DIAGNOSIS — G4733 Obstructive sleep apnea (adult) (pediatric): Secondary | ICD-10-CM | POA: Diagnosis not present

## 2018-08-16 DIAGNOSIS — Z1339 Encounter for screening examination for other mental health and behavioral disorders: Secondary | ICD-10-CM | POA: Diagnosis not present

## 2018-08-16 DIAGNOSIS — E7800 Pure hypercholesterolemia, unspecified: Secondary | ICD-10-CM | POA: Diagnosis not present

## 2018-08-22 DIAGNOSIS — N401 Enlarged prostate with lower urinary tract symptoms: Secondary | ICD-10-CM | POA: Diagnosis not present

## 2018-08-22 DIAGNOSIS — N3943 Post-void dribbling: Secondary | ICD-10-CM | POA: Diagnosis not present

## 2018-08-22 DIAGNOSIS — Z87442 Personal history of urinary calculi: Secondary | ICD-10-CM | POA: Diagnosis not present

## 2018-08-22 DIAGNOSIS — L209 Atopic dermatitis, unspecified: Secondary | ICD-10-CM | POA: Diagnosis not present

## 2018-08-23 DIAGNOSIS — Z1212 Encounter for screening for malignant neoplasm of rectum: Secondary | ICD-10-CM | POA: Diagnosis not present

## 2018-10-15 ENCOUNTER — Other Ambulatory Visit: Payer: Self-pay | Admitting: Neurology

## 2018-10-17 ENCOUNTER — Ambulatory Visit: Payer: PPO | Admitting: Neurology

## 2018-11-11 DIAGNOSIS — H0288B Meibomian gland dysfunction left eye, upper and lower eyelids: Secondary | ICD-10-CM | POA: Diagnosis not present

## 2018-11-11 DIAGNOSIS — H53453 Other localized visual field defect, bilateral: Secondary | ICD-10-CM | POA: Diagnosis not present

## 2018-11-11 DIAGNOSIS — H0288A Meibomian gland dysfunction right eye, upper and lower eyelids: Secondary | ICD-10-CM | POA: Diagnosis not present

## 2018-11-11 DIAGNOSIS — H0102B Squamous blepharitis left eye, upper and lower eyelids: Secondary | ICD-10-CM | POA: Diagnosis not present

## 2018-11-11 DIAGNOSIS — H02834 Dermatochalasis of left upper eyelid: Secondary | ICD-10-CM | POA: Diagnosis not present

## 2018-11-11 DIAGNOSIS — H02831 Dermatochalasis of right upper eyelid: Secondary | ICD-10-CM | POA: Diagnosis not present

## 2018-11-11 DIAGNOSIS — H0102A Squamous blepharitis right eye, upper and lower eyelids: Secondary | ICD-10-CM | POA: Diagnosis not present

## 2018-11-11 DIAGNOSIS — H04123 Dry eye syndrome of bilateral lacrimal glands: Secondary | ICD-10-CM | POA: Diagnosis not present

## 2018-11-11 DIAGNOSIS — H16143 Punctate keratitis, bilateral: Secondary | ICD-10-CM | POA: Diagnosis not present

## 2018-12-06 DIAGNOSIS — H0288B Meibomian gland dysfunction left eye, upper and lower eyelids: Secondary | ICD-10-CM | POA: Diagnosis not present

## 2018-12-06 DIAGNOSIS — H04123 Dry eye syndrome of bilateral lacrimal glands: Secondary | ICD-10-CM | POA: Diagnosis not present

## 2018-12-06 DIAGNOSIS — H53453 Other localized visual field defect, bilateral: Secondary | ICD-10-CM | POA: Diagnosis not present

## 2018-12-06 DIAGNOSIS — H0102A Squamous blepharitis right eye, upper and lower eyelids: Secondary | ICD-10-CM | POA: Diagnosis not present

## 2018-12-06 DIAGNOSIS — H02831 Dermatochalasis of right upper eyelid: Secondary | ICD-10-CM | POA: Diagnosis not present

## 2018-12-06 DIAGNOSIS — H40013 Open angle with borderline findings, low risk, bilateral: Secondary | ICD-10-CM | POA: Diagnosis not present

## 2018-12-06 DIAGNOSIS — H02834 Dermatochalasis of left upper eyelid: Secondary | ICD-10-CM | POA: Diagnosis not present

## 2018-12-06 DIAGNOSIS — H16143 Punctate keratitis, bilateral: Secondary | ICD-10-CM | POA: Diagnosis not present

## 2018-12-06 DIAGNOSIS — H0102B Squamous blepharitis left eye, upper and lower eyelids: Secondary | ICD-10-CM | POA: Diagnosis not present

## 2018-12-06 DIAGNOSIS — H0288A Meibomian gland dysfunction right eye, upper and lower eyelids: Secondary | ICD-10-CM | POA: Diagnosis not present

## 2018-12-07 ENCOUNTER — Ambulatory Visit: Payer: PPO | Admitting: Neurology

## 2018-12-07 ENCOUNTER — Other Ambulatory Visit: Payer: Self-pay

## 2018-12-07 ENCOUNTER — Ambulatory Visit (INDEPENDENT_AMBULATORY_CARE_PROVIDER_SITE_OTHER): Payer: PPO | Admitting: Family Medicine

## 2018-12-07 ENCOUNTER — Encounter: Payer: Self-pay | Admitting: Family Medicine

## 2018-12-07 VITALS — BP 114/75 | HR 71 | Temp 97.8°F | Ht 70.0 in | Wt 187.2 lb

## 2018-12-07 DIAGNOSIS — F0280 Dementia in other diseases classified elsewhere without behavioral disturbance: Secondary | ICD-10-CM

## 2018-12-07 DIAGNOSIS — G301 Alzheimer's disease with late onset: Secondary | ICD-10-CM
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
## 2018-12-07 NOTE — Patient Instructions (Signed)
  Continue current treatment regimen  Follow up in 1 year  Memory Compensation Strategies  1. Use "WARM" strategy.  W= write it down  A= associate it  R= repeat it  M= make a mental note  2.   You can keep a Social worker.  Use a 3-ring notebook with sections for the following: calendar, important names and phone numbers,  medications, doctors' names/phone numbers, lists/reminders, and a section to journal what you did  each day.   3.    Use a calendar to write appointments down.  4.    Write yourself a schedule for the day.  This can be placed on the calendar or in a separate section of the Memory Notebook.  Keeping a  regular schedule can help memory.  5.    Use medication organizer with sections for each day or morning/evening pills.  You may need help loading it  6.    Keep a basket, or pegboard by the door.  Place items that you need to take out with you in the basket or on the pegboard.  You may also want to  include a message board for reminders.  7.    Use sticky notes.  Place sticky notes with reminders in a place where the task is performed.  For example: " turn off the  stove" placed by the stove, "lock the door" placed on the door at eye level, " take your medications" on  the bathroom mirror or by the place where you normally take your medications.  8.    Use alarms/timers.  Use while cooking to remind yourself to check on food or as a reminder to take your medicine, or as a  reminder to make a call, or as a reminder to perform another task, etc.

## 2018-12-08 ENCOUNTER — Encounter: Payer: Self-pay | Admitting: Family Medicine

## 2018-12-08 NOTE — Progress Notes (Signed)
Made any corrections needed, and agree with history, physical, neuro exam,assessment and plan as stated.     Braysen Cloward, MD [facility]  

## 2018-12-26 ENCOUNTER — Ambulatory Visit: Payer: PPO | Admitting: Internal Medicine

## 2019-01-26 ENCOUNTER — Other Ambulatory Visit: Payer: Self-pay | Admitting: Neurology

## 2019-01-26 DIAGNOSIS — H43813 Vitreous degeneration, bilateral: Secondary | ICD-10-CM | POA: Diagnosis not present

## 2019-01-26 DIAGNOSIS — F0390 Unspecified dementia without behavioral disturbance: Secondary | ICD-10-CM | POA: Diagnosis not present

## 2019-01-26 DIAGNOSIS — H33321 Round hole, right eye: Secondary | ICD-10-CM | POA: Diagnosis not present

## 2019-01-26 DIAGNOSIS — H31009 Unspecified chorioretinal scars, unspecified eye: Secondary | ICD-10-CM | POA: Diagnosis not present

## 2019-02-08 ENCOUNTER — Other Ambulatory Visit: Payer: Self-pay | Admitting: Neurology

## 2019-02-16 DIAGNOSIS — G4733 Obstructive sleep apnea (adult) (pediatric): Secondary | ICD-10-CM | POA: Diagnosis not present

## 2019-02-16 DIAGNOSIS — N3010 Interstitial cystitis (chronic) without hematuria: Secondary | ICD-10-CM | POA: Diagnosis not present

## 2019-02-16 DIAGNOSIS — F0390 Unspecified dementia without behavioral disturbance: Secondary | ICD-10-CM | POA: Diagnosis not present

## 2019-02-16 DIAGNOSIS — R7302 Impaired glucose tolerance (oral): Secondary | ICD-10-CM | POA: Diagnosis not present

## 2019-02-16 DIAGNOSIS — D692 Other nonthrombocytopenic purpura: Secondary | ICD-10-CM | POA: Diagnosis not present

## 2019-02-16 DIAGNOSIS — E7800 Pure hypercholesterolemia, unspecified: Secondary | ICD-10-CM | POA: Diagnosis not present

## 2019-02-16 DIAGNOSIS — F419 Anxiety disorder, unspecified: Secondary | ICD-10-CM | POA: Diagnosis not present

## 2019-04-04 ENCOUNTER — Other Ambulatory Visit: Payer: Self-pay

## 2019-04-04 ENCOUNTER — Ambulatory Visit: Payer: PPO | Admitting: Internal Medicine

## 2019-04-04 ENCOUNTER — Encounter: Payer: Self-pay | Admitting: Internal Medicine

## 2019-04-04 VITALS — BP 112/72 | HR 82 | Temp 97.2°F | Ht 70.0 in | Wt 186.2 lb

## 2019-04-04 DIAGNOSIS — F0280 Dementia in other diseases classified elsewhere without behavioral disturbance: Secondary | ICD-10-CM

## 2019-04-04 DIAGNOSIS — G4733 Obstructive sleep apnea (adult) (pediatric): Secondary | ICD-10-CM

## 2019-04-04 DIAGNOSIS — G301 Alzheimer's disease with late onset: Secondary | ICD-10-CM

## 2019-04-04 NOTE — Assessment & Plan Note (Signed)
He dropped off CPAP over the past few weeks, ostensibly with humidifier issues. Prior was getting good control. Plan- DME service machine. Ask them to also communicate with wife so she understands and can help her husband. Continue auto 5-15.

## 2019-04-04 NOTE — Patient Instructions (Signed)
Order- DME [facility]- Please service CPAP machine. Humidifier may not be working well.  Please review suggestions with wife. Continue auto 5-15, mask of choice, humidifier, supplies, AirView/ card  Please call if we can help

## 2019-04-04 NOTE — Progress Notes (Signed)
Subjective:    Patient ID: Dylan [name], male    DOB: [birth year], 83 y.o.   MRN: [id number]  HPI M Never smoker followed for OSA, Restless Legs, hx allergy.  -----------------------------------------------------------------------------------------  12/23/2017- 83 year old male never smoker followed for OSA, Restless Legs, history allergic rhinitis, complicated by GERD, Alzheimer's/ cerebral amyloid angiopathy,  CPAP auto 5-15/[facility] -----osa, denies problems, sleeping well Broken CPAP machine was replaced in May and he is now doing very well.  Wife confirms he sleeps much better with CPAP.  Download 93% compliance AHI 0.3/hour.  Very comfortable with AutoPap. Wife reports he is not having significant problems with rhinitis symptoms..  04/04/2019- 83 year old male never smoker followed for OSA, Restless Legs, history allergic rhinitis, complicated by GERD, Alzheimer's/ cerebral amyloid angiopathy,  CPAP auto 5-15/[facility] Followed by Neurology for Alzheimer's. -----1 year follow up OSA Download compliance 50%, with little use since mid-October. AHI then was 0.8/ hr Having trouble with humidifier. Wife here, but she has deferred CPAP issues to him and with his Alzheimers, I'm not sure about comprehension. Apparently humidifier not running dry.  Denies breathing discomfort. UTD flu vax.  Wife notes some coughing with meals, suggesting LPR- to f/u with PCP.   ROS-see HPI    + = positive Constitutional:   No-   weight loss, night sweats, fevers, chills, fatigue, lassitude. HEENT:   No-  headaches, difficulty swallowing, tooth/dental problems, sore throat,       No-  sneezing, itching, ear ache, nasal congestion, post nasal drip,  CV:  No-   chest pain, orthopnea, PND, swelling in lower extremities, anasarca, dizziness, palpitations Resp:-  shortness of breath with exertion or at rest.              No-   productive cough,   non-productive cough,  No- coughing up of blood.              No-    change in color of mucus.   wheezing.   Skin: No-   rash or lesions. GI:  +  heartburn, no-indigestion, abdominal pain, nausea, vomiting,  GU:  MS:  No-   joint pain or swelling.  Neuro-     Per HPI. + Memory loss is progressive. Psych:  No- change in mood or affect. No depression or anxiety.  No memory loss.  OBJ- Physical Exam General- Alert, Oriented, Affect-appropriate, Distress- none acute,  Skin- rash-none, lesions- none, excoriation- none Lymphadenopathy- none Head- atraumatic            Eyes- Gross vision intact, PERRLA, conjunctivae and secretions clear            Ears- grossly normal hearing            Nose- Clear, no-Septal dev, mucus, polyps, erosion, perforation             Throat- Mallampati II-III , mucosa-normal, drainage- none, tonsils- atrophic Neck- flexible , trachea midline, no stridor , thyroid nl, carotid no bruit Chest - symmetrical excursion , unlabored           Heart/CV- RRR , no murmur , no gallop  , no rub, nl s1 s2                           - JVD- none , edema- none, stasis changes- none, varices- none           Lung- clear/unlabored, wheeze- none, cough- none , dullness-none, rub- none  Chest wall-  Abd-  Br/ Gen/ Rectal- Not done, not indicated Extrem- cyanosis- none, clubbing, none, atrophy- none, strength- nl Neuro- + obviously looking to wife to answer questions

## 2019-04-04 NOTE — Assessment & Plan Note (Signed)
Being followed by Neurology/ [facility]. Not sure if this is part of his problem with CPAP use, but important that his wife stay in communication loop so she can help him.

## 2019-04-05 ENCOUNTER — Telehealth: Payer: Self-pay | Admitting: *Deleted

## 2019-04-05 NOTE — Telephone Encounter (Signed)
Pt wife [name] called, request a referral to local psychologist in [place]. Please call [phone].

## 2019-04-06 NOTE — Telephone Encounter (Signed)
I called the pt's wife back and discussed. She was asking about a f/u with neuropsychology. Had testing with Dr. Richrd Sox last year. Advised we can schedule sooner appt here with Amy NP or, per [name], we can order repeat neuropsych testing. The wife declined any acute change, only said this was degenerative.  Wife said she may make an appt for the first of the year, but she will call back when she decides. Will keep July '21 f/u with Amy as scheduled. The wife understands for any emergencies, call 911, for other acute changes see PCP. Asked for a call back when she is ready to schedule. Her questions were answered during the call. She verbalized appreciation.

## 2019-04-07 DIAGNOSIS — G301 Alzheimer's disease with late onset: Secondary | ICD-10-CM | POA: Diagnosis not present

## 2019-04-07 DIAGNOSIS — F0280 Dementia in other diseases classified elsewhere without behavioral disturbance: Secondary | ICD-10-CM | POA: Diagnosis not present

## 2019-04-30 ENCOUNTER — Other Ambulatory Visit: Payer: Self-pay | Admitting: Neurology

## 2019-05-19 DIAGNOSIS — G4733 Obstructive sleep apnea (adult) (pediatric): Secondary | ICD-10-CM | POA: Diagnosis not present

## 2019-06-08 ENCOUNTER — Ambulatory Visit: Payer: PPO | Attending: Internal Medicine

## 2019-06-08 DIAGNOSIS — Z03818 Encounter for observation for suspected exposure to other biological agents ruled out: Secondary | ICD-10-CM | POA: Diagnosis not present

## 2019-06-08 DIAGNOSIS — Z20822 Contact with and (suspected) exposure to covid-19: Secondary | ICD-10-CM

## 2019-06-10 LAB — NOVEL CORONAVIRUS, NAA: SARS-CoV-2, NAA: NOT DETECTED

## 2019-08-17 DIAGNOSIS — E7800 Pure hypercholesterolemia, unspecified: Secondary | ICD-10-CM | POA: Diagnosis not present

## 2019-08-17 DIAGNOSIS — R7302 Impaired glucose tolerance (oral): Secondary | ICD-10-CM | POA: Diagnosis not present

## 2019-08-17 DIAGNOSIS — Z125 Encounter for screening for malignant neoplasm of prostate: Secondary | ICD-10-CM | POA: Diagnosis not present

## 2019-08-24 DIAGNOSIS — R7302 Impaired glucose tolerance (oral): Secondary | ICD-10-CM | POA: Diagnosis not present

## 2019-08-24 DIAGNOSIS — Z1339 Encounter for screening examination for other mental health and behavioral disorders: Secondary | ICD-10-CM | POA: Diagnosis not present

## 2019-08-24 DIAGNOSIS — F0390 Unspecified dementia without behavioral disturbance: Secondary | ICD-10-CM | POA: Diagnosis not present

## 2019-08-24 DIAGNOSIS — G4733 Obstructive sleep apnea (adult) (pediatric): Secondary | ICD-10-CM | POA: Diagnosis not present

## 2019-08-24 DIAGNOSIS — H9190 Unspecified hearing loss, unspecified ear: Secondary | ICD-10-CM | POA: Diagnosis not present

## 2019-08-24 DIAGNOSIS — N401 Enlarged prostate with lower urinary tract symptoms: Secondary | ICD-10-CM | POA: Diagnosis not present

## 2019-08-24 DIAGNOSIS — D692 Other nonthrombocytopenic purpura: Secondary | ICD-10-CM | POA: Diagnosis not present

## 2019-08-24 DIAGNOSIS — F419 Anxiety disorder, unspecified: Secondary | ICD-10-CM | POA: Diagnosis not present

## 2019-08-24 DIAGNOSIS — D126 Benign neoplasm of colon, unspecified: Secondary | ICD-10-CM | POA: Diagnosis not present

## 2019-08-24 DIAGNOSIS — E7800 Pure hypercholesterolemia, unspecified: Secondary | ICD-10-CM | POA: Diagnosis not present

## 2019-08-24 DIAGNOSIS — R972 Elevated prostate specific antigen [PSA]: Secondary | ICD-10-CM | POA: Diagnosis not present

## 2019-08-24 DIAGNOSIS — N3010 Interstitial cystitis (chronic) without hematuria: Secondary | ICD-10-CM | POA: Diagnosis not present

## 2019-08-24 DIAGNOSIS — Z0000 Encounter for general adult medical examination without abnormal findings: Secondary | ICD-10-CM | POA: Diagnosis not present

## 2019-08-24 DIAGNOSIS — Z1331 Encounter for screening for depression: Secondary | ICD-10-CM | POA: Diagnosis not present

## 2019-09-11 DIAGNOSIS — N3943 Post-void dribbling: Secondary | ICD-10-CM | POA: Diagnosis not present

## 2019-09-11 DIAGNOSIS — L209 Atopic dermatitis, unspecified: Secondary | ICD-10-CM | POA: Diagnosis not present

## 2019-09-11 DIAGNOSIS — N200 Calculus of kidney: Secondary | ICD-10-CM | POA: Diagnosis not present

## 2019-09-11 DIAGNOSIS — N401 Enlarged prostate with lower urinary tract symptoms: Secondary | ICD-10-CM | POA: Diagnosis not present

## 2019-09-18 DIAGNOSIS — H04123 Dry eye syndrome of bilateral lacrimal glands: Secondary | ICD-10-CM | POA: Diagnosis not present

## 2019-09-18 DIAGNOSIS — H0102A Squamous blepharitis right eye, upper and lower eyelids: Secondary | ICD-10-CM | POA: Diagnosis not present

## 2019-09-18 DIAGNOSIS — H40013 Open angle with borderline findings, low risk, bilateral: Secondary | ICD-10-CM | POA: Diagnosis not present

## 2019-09-18 DIAGNOSIS — H0102B Squamous blepharitis left eye, upper and lower eyelids: Secondary | ICD-10-CM | POA: Diagnosis not present

## 2019-09-18 DIAGNOSIS — H02831 Dermatochalasis of right upper eyelid: Secondary | ICD-10-CM | POA: Diagnosis not present

## 2019-09-18 DIAGNOSIS — H16143 Punctate keratitis, bilateral: Secondary | ICD-10-CM | POA: Diagnosis not present

## 2019-09-18 DIAGNOSIS — H02834 Dermatochalasis of left upper eyelid: Secondary | ICD-10-CM | POA: Diagnosis not present

## 2019-09-18 DIAGNOSIS — H00022 Hordeolum internum right lower eyelid: Secondary | ICD-10-CM | POA: Diagnosis not present

## 2019-09-18 DIAGNOSIS — H0288B Meibomian gland dysfunction left eye, upper and lower eyelids: Secondary | ICD-10-CM | POA: Diagnosis not present

## 2019-09-18 DIAGNOSIS — H0288A Meibomian gland dysfunction right eye, upper and lower eyelids: Secondary | ICD-10-CM | POA: Diagnosis not present

## 2019-09-19 ENCOUNTER — Encounter (INDEPENDENT_AMBULATORY_CARE_PROVIDER_SITE_OTHER): Payer: Self-pay | Admitting: Ophthalmology

## 2019-09-19 ENCOUNTER — Other Ambulatory Visit: Payer: Self-pay

## 2019-09-19 ENCOUNTER — Ambulatory Visit (INDEPENDENT_AMBULATORY_CARE_PROVIDER_SITE_OTHER): Payer: PPO | Admitting: Ophthalmology

## 2019-09-19 DIAGNOSIS — F0390 Unspecified dementia without behavioral disturbance: Secondary | ICD-10-CM

## 2019-09-19 DIAGNOSIS — H43813 Vitreous degeneration, bilateral: Secondary | ICD-10-CM | POA: Diagnosis not present

## 2019-09-19 DIAGNOSIS — H33321 Round hole, right eye: Secondary | ICD-10-CM

## 2019-09-19 DIAGNOSIS — H31009 Unspecified chorioretinal scars, unspecified eye: Secondary | ICD-10-CM | POA: Insufficient documentation

## 2019-09-19 DIAGNOSIS — F03C0 Unspecified dementia, severe, without behavioral disturbance, psychotic disturbance, mood disturbance, and anxiety: Secondary | ICD-10-CM

## 2019-09-19 NOTE — Assessment & Plan Note (Signed)

## 2019-09-19 NOTE — Progress Notes (Signed)
09/19/2019     CHIEF COMPLAINT Patient presents for Retina Follow Up   HISTORY OF PRESENT ILLNESS: Dylan [name] is a 84 y.o. male who presents to the clinic today for:   HPI    Retina Follow Up    Patient presents with  PVD.  In both eyes.  Duration of 1 year.  Since onset it is stable.          Comments    1 year follow up - FP OU Patient denies change in vision and overall has no complaints besides floaters.        Last edited by Gerda Diss on 09/19/2019  9:37 AM. (History)      Referring physician: [name], MD [place],  [place] [id number]  HISTORICAL INFORMATION:   Selected notes from the [facility]: No current outpatient medications on file. (Ophthalmic Drugs)   No current facility-administered medications for this visit. (Ophthalmic Drugs)   Current Outpatient Medications (Other)  Medication Sig  . B Complex-C-Folic Acid (SUPER B COMPLEX/FA/VIT C PO) Take 1 tablet by mouth daily.  Marland Kitchen doxycycline (VIBRA-TABS) 100 MG tablet Take 100 mg by mouth 2 (two) times daily.  . fluorouracil (EFUDEX) 5 % cream 1 APPLICATION APPLY TO THE SCALP NIGHTLY APPLY TO SCALP NIGHTLY FOR14 DAYS  . ketoconazole (NIZORAL) 2 % cream Apply 1 application 2 (two) times daily topically.  . memantine (NAMENDA) 10 MG tablet TAKE 1 TABLET BY MOUTH TWICE A DAY  . pentosan polysulfate (ELMIRON) 100 MG capsule Take 100 mg by mouth 3 (three) times daily.   . potassium citrate (UROCIT-K) 10 MEQ (1080 MG) SR tablet Take 2 tablets by mouth 2 (two) times daily.   . ranitidine (ZANTAC) 75 MG tablet Take 75 mg by mouth 2 (two) times daily as needed.   . rivastigmine (EXELON) 9.5 mg/24hr Place 9.5 mg onto the skin daily.  . sertraline (ZOLOFT) 50 MG tablet Take 100 mg by mouth daily.   . simvastatin (ZOCOR) 80 MG tablet Take 80 mg by mouth at bedtime.    . tamsulosin (FLOMAX) 0.4 MG CAPS capsule Take 0.4 mg by mouth daily.  . Turmeric  (QC TUMERIC COMPLEX) 500 MG CAPS Take 1 capsule by mouth daily.   No current facility-administered medications for this visit. (Other)      REVIEW OF SYSTEMS:    ALLERGIES No Known Allergies  PAST MEDICAL HISTORY Past Medical History:  Diagnosis Date  . High cholesterol   . Kidney stone   . Memory loss   . Sleep apnea    NPSG 08-03-07 AHI 10/hr, RDI 19.7/hr   Past Surgical History:  Procedure Laterality Date  . right clavicle repair  1954   fx  . [place]  . WISDOM TOOTH EXTRACTION  1961    FAMILY HISTORY Family History  Problem Relation Age of Onset  . Heart disease Father   . Stroke Father   . Cancer Mother   . Pulmonary embolism Mother     SOCIAL HISTORY Social History   Tobacco Use  . Smoking status: Never Smoker  . Smokeless tobacco: Never Used  Substance Use Topics  . Alcohol use: Yes    Alcohol/week: 1.0 standard drinks    Types: 1 Glasses of wine per week    Comment: Socially; not weekly use  . Drug use: No         OPHTHALMIC EXAM:  Base  Eye Exam    Visual Acuity (Snellen - Linear)      Right Left   Dist [place] 20/20-2 20/20-1       Tonometry (Tonopen, 9:45 AM)      Right Left   Pressure 9 8       Pupils      Pupils Dark Light Shape React APD   Right PERRL 2 2 Round Minimal None   Left PERRL 2 2 Round Minimal None       Visual Fields (Counting fingers)      Left Right    Full Full       Extraocular Movement      Right Left    Full Full       Neuro/Psych    Oriented x3: Yes   Mood/Affect: Normal       Dilation    Both eyes: 1.0% Mydriacyl, 2.5% Phenylephrine @ 9:45 AM        Slit Lamp and Fundus Exam    External Exam      Right Left   External Normal Normal       Slit Lamp Exam      Right Left   Lids/Lashes Normal Normal   Conjunctiva/Sclera White and quiet White and quiet   Cornea Clear Clear   Anterior Chamber Deep and quiet Deep and quiet   Iris Round and reactive Round and reactive   Lens  Posterior chamber intraocular lens Posterior chamber intraocular lens   Anterior Vitreous Normal Normal       Fundus Exam      Right Left   Posterior Vitreous Posterior vitreous detachment Posterior vitreous detachment   Disc Normal Normal   C/D Ratio 0.1 0.3   Macula Normal Normal   Vessels Normal Normal   Periphery Normal Normal          IMAGING AND PROCEDURES  Imaging and Procedures for 09/19/19  Color Fundus Photography Optos - OU - Both Eyes       Right Eye Progression has improved. Disc findings include normal observations. Macula : normal observations. Vessels : normal observations. Periphery : normal observations.   Left Eye Progression has been stable. Disc findings include normal observations. Macula : normal observations. Vessels : normal observations. Periphery : normal observations.   Notes OU, with posterior vitreous detachment.,  Clear media.  No signs of glaucoma damage.                ASSESSMENT/PLAN:  Posterior vitreous detachment of both eyes   The nature of posterior vitreous detachment was discussed with the patient as well as its physiology, its age prevalence, and its possible implication regarding retinal breaks and detachment.  An informational brochure was given to the patient.  All the patient's questions were answered.  The patient was asked to return if new or different flashes or floaters develops.   Patient was instructed to contact office immediately if any changes were noticed. I explained to the patient that vitreous inside the eye is similar to jello inside a bowl. As the jello melts it can start to pull away from the bowl, similarly the vitreous throughout our lives can begin to pull away from the retina. That process is called a posterior vitreous detachment. In some cases, the vitreous can tug hard enough on the retina to form a retinal tear. I discussed with the patient the signs and symptoms of a retinal detachment.  Do not rub the  eye.  ICD-10-CM   1. 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 Posterior vitreous detachment of both eyes  H43.813   3. Advanced dementia ([place])  F03.90   4. Round hole of right eye  H33.321     1.  2.  3.  Ophthalmic Meds Ordered this visit:  No orders of the defined types were placed in this encounter.      No follow-ups on file.  There are no Patient Instructions on file for this visit.   Explained the diagnoses, plan, and follow up with the patient and they expressed understanding.  Patient expressed understanding of the importance of proper follow up care.   Clent Demark Tiea Manninen M.D. Diseases & Surgery of the Retina and Vitreous [facility] [place] 09/19/19     Abbreviations: M myopia (nearsighted); A astigmatism; H hyperopia (farsighted); P presbyopia; Mrx spectacle prescription;  CTL contact lenses; OD right eye; OS left eye; OU both eyes  XT exotropia; ET esotropia; PEK punctate epithelial keratitis; PEE punctate epithelial erosions; DES dry eye syndrome; MGD meibomian gland dysfunction; ATs artificial tears; PFAT's preservative free artificial tears; [place] nuclear sclerotic cataract; PSC posterior subcapsular cataract; ERM epi-retinal membrane; PVD posterior vitreous detachment; RD retinal detachment; DM diabetes mellitus; DR diabetic retinopathy; NPDR non-proliferative diabetic retinopathy; PDR proliferative diabetic retinopathy; CSME clinically significant macular edema; DME diabetic macular edema; dbh dot blot hemorrhages; CWS cotton wool spot; POAG primary open angle glaucoma; C/D cup-to-disc ratio; HVF humphrey visual field; GVF goldmann visual field; OCT optical coherence tomography; IOP intraocular pressure; BRVO Branch retinal vein occlusion; CRVO central retinal vein occlusion; CRAO central retinal artery occlusion; BRAO branch retinal artery occlusion; RT retinal tear; SB  scleral buckle; PPV pars plana vitrectomy; VH Vitreous hemorrhage; PRP panretinal laser photocoagulation; IVK intravitreal kenalog; VMT vitreomacular traction; MH Macular hole;  NVD neovascularization of the disc; NVE neovascularization elsewhere; AREDS age related eye disease study; ARMD age related macular degeneration; POAG primary open angle glaucoma; EBMD epithelial/anterior basement membrane dystrophy; ACIOL anterior chamber intraocular lens; IOL intraocular lens; PCIOL posterior chamber intraocular lens; Phaco/IOL phacoemulsification with intraocular lens placement; [place] photorefractive keratectomy; LASIK laser assisted in situ keratomileusis; HTN hypertension; DM diabetes mellitus; COPD chronic obstructive pulmonary disease

## 2019-09-25 ENCOUNTER — Ambulatory Visit: Payer: PPO | Admitting: Family Medicine

## 2019-09-25 ENCOUNTER — Other Ambulatory Visit: Payer: Self-pay

## 2019-09-25 ENCOUNTER — Encounter: Payer: Self-pay | Admitting: Family Medicine

## 2019-09-25 VITALS — BP 137/77 | HR 79 | Temp 97.0°F | Ht 70.0 in | Wt 180.0 lb

## 2019-09-25 DIAGNOSIS — G301 Alzheimer's disease with late onset: Secondary | ICD-10-CM | POA: Diagnosis not present

## 2019-09-25 DIAGNOSIS — E854 Organ-limited amyloidosis: Secondary | ICD-10-CM | POA: Diagnosis not present

## 2019-09-25 DIAGNOSIS — H02834 Dermatochalasis of left upper eyelid: Secondary | ICD-10-CM | POA: Diagnosis not present

## 2019-09-25 DIAGNOSIS — F0280 Dementia in other diseases classified elsewhere without behavioral disturbance: Secondary | ICD-10-CM | POA: Diagnosis not present

## 2019-09-25 DIAGNOSIS — H02831 Dermatochalasis of right upper eyelid: Secondary | ICD-10-CM | POA: Diagnosis not present

## 2019-09-25 DIAGNOSIS — H0102B Squamous blepharitis left eye, upper and lower eyelids: Secondary | ICD-10-CM | POA: Diagnosis not present

## 2019-09-25 DIAGNOSIS — I680 Cerebral amyloid angiopathy: Secondary | ICD-10-CM | POA: Diagnosis not present

## 2019-09-25 DIAGNOSIS — H0102A Squamous blepharitis right eye, upper and lower eyelids: Secondary | ICD-10-CM | POA: Diagnosis not present

## 2019-09-25 DIAGNOSIS — H04123 Dry eye syndrome of bilateral lacrimal glands: Secondary | ICD-10-CM | POA: Diagnosis not present

## 2019-09-25 DIAGNOSIS — H00022 Hordeolum internum right lower eyelid: Secondary | ICD-10-CM | POA: Diagnosis not present

## 2019-09-25 DIAGNOSIS — H0288B Meibomian gland dysfunction left eye, upper and lower eyelids: Secondary | ICD-10-CM | POA: Diagnosis not present

## 2019-09-25 DIAGNOSIS — H0288A Meibomian gland dysfunction right eye, upper and lower eyelids: Secondary | ICD-10-CM | POA: Diagnosis not present

## 2019-09-25 DIAGNOSIS — H16143 Punctate keratitis, bilateral: Secondary | ICD-10-CM | POA: Diagnosis not present

## 2019-09-25 DIAGNOSIS — H40013 Open angle with borderline findings, low risk, bilateral: Secondary | ICD-10-CM | POA: Diagnosis not present

## 2019-09-25 NOTE — Patient Instructions (Signed)
We will continue Namenda and Exelon as prescribed. We will continue follow up with [facility]. Stay physically active. Eat well balanced diet and stay well hydrated. Continue close follow up with PCP.   Follow up in 6-12 months   Memory Compensation Strategies  1. Use "WARM" strategy.  W= write it down  A= associate it  R= repeat it  M= make a mental note  2.   You can keep a Social worker.  Use a 3-ring notebook with sections for the following: calendar, important names and phone numbers,  medications, doctors' names/phone numbers, lists/reminders, and a section to journal what you did  each day.   3.    Use a calendar to write appointments down.  4.    Write yourself a schedule for the day.  This can be placed on the calendar or in a separate section of the Memory Notebook.  Keeping a  regular schedule can help memory.  5.    Use medication organizer with sections for each day or morning/evening pills.  You may need help loading it  6.    Keep a basket, or pegboard by the door.  Place items that you need to take out with you in the basket or on the pegboard.  You may also want to  include a message board for reminders.  7.    Use sticky notes.  Place sticky notes with reminders in a place where the task is performed.  For example: " turn off the  stove" placed by the stove, "lock the door" placed on the door at eye level, " take your medications" on  the bathroom mirror or by the place where you normally take your medications.  8.    Use alarms/timers.  Use while cooking to remind yourself to check on food or as a reminder to take your medicine, or as a  reminder to make a call, or as a reminder to perform another task, etc.   Dementia Caregiver Guide Dementia is a term used to describe a number of symptoms that affect memory and thinking. The most common symptoms include:  Memory loss.  Trouble with language and communication.  Trouble concentrating.  Poor  judgment.  Problems with reasoning.  Child-like behavior and language.  Extreme anxiety.  Angry outbursts.  Wandering from home or public places. Dementia usually gets worse slowly over time. In the early stages, people with dementia can stay independent and safe with some help. In later stages, they need help with daily tasks such as dressing, grooming, and using the bathroom. How to help the person with dementia cope Dementia can be frightening and confusing. Here are some tips to help the person with dementia cope with changes caused by the disease. General tips  Keep the person on track with his or her routine.  Try to identify areas where the person may need help.  Be supportive, patient, calm, and encouraging.  Gently remind the person that adjusting to changes takes time.  Help with the tasks that the person has asked for help with.  Keep the person involved in daily tasks and decisions as much as possible.  Encourage conversation, but try not to get frustrated or harried if the person struggles to find words or does not seem to appreciate your help. Communication tips  When the person is talking or seems frustrated, make eye contact and hold the person's hand.  Ask specific questions that need yes or no answers.  Use simple words, short  sentences, and a calm voice. Only give one direction at a time.  When offering choices, limit them to just 1 or 2.  Avoid correcting the person in a negative way.  If the person is struggling to find the right words, gently try to help him or her. How to recognize symptoms of stress Symptoms of stress in caregivers include:  Feeling frustrated or angry with the person with dementia.  Denying that the person has dementia or that his or her symptoms will not improve.  Feeling hopeless and unappreciated.  Difficulty sleeping.  Difficulty concentrating.  Feeling anxious, irritable, or depressed.  Developing stress-related  health problems.  Feeling like you have too little time for your own life. Follow these instructions at home:   Make sure that you and the person you are caring for: ? Get regular sleep. ? Exercise regularly. ? Eat regular, nutritious meals. ? Drink enough fluid to keep your urine clear or pale yellow. ? Take over-the-counter and prescription medicines only as told by your health care providers. ? Attend all scheduled health care appointments.  Join a support group with others who are caregivers.  Ask about respite care resources so that you can have a regular break from the stress of caregiving.  Look for signs of stress in yourself and in the person you are caring for. If you notice signs of stress, take steps to manage it.  Consider any safety risks and take steps to avoid them.  Organize medications in a pill box for each day of the week.  Create a plan to handle any legal or financial matters. Get legal or financial advice if needed.  Keep a calendar in a central location to remind the person of appointments or other activities. Tips for reducing the risk of injury  Keep floors clear of clutter. Remove rugs, magazine racks, and floor lamps.  Keep hallways well lit, especially at night.  Put a handrail and nonslip mat in the bathtub or shower.  Put childproof locks on cabinets that contain dangerous items, such as medicines, alcohol, guns, toxic cleaning items, sharp tools or utensils, matches, and lighters.  Put the locks in places where the person cannot see or reach them easily. This will help ensure that the person does not wander out of the house and get lost.  Be prepared for emergencies. Keep a list of emergency phone numbers and addresses in a convenient area.  Remove car keys and lock garage doors so that the person does not try to get in the car and drive.  Have the person wear a bracelet that tracks locations and identifies the person as having memory  problems. This should be worn at all times for safety. Where to find support: Many individuals and organizations offer support. These include:  Support groups for people with dementia and for caregivers.  Counselors or therapists.  Home health care services.  Adult day care centers. Where to find more information Alzheimer's Association: [employer] Contact a health care provider if:  The person's health is rapidly getting worse.  You are no longer able to care for the person.  Caring for the person is affecting your physical and emotional health.  The person threatens himself or herself, you, or anyone else. Summary  Dementia is a term used to describe a number of symptoms that affect memory and thinking.  Dementia usually gets worse slowly over time.  Take steps to reduce the person's risk of injury, and to plan for future care.  Caregivers need support, relief from caregiving, and time for their own lives. This information is not intended to replace advice given to you by your health care provider. Make sure you discuss any questions you have with your health care provider. Document Revised: 04/30/2017 Document Reviewed: 04/21/2016 Elsevier Patient Education  2020 [employer].

## 2019-09-25 NOTE — Progress Notes (Addendum)
PATIENT: Dylan [name] DOB: 09-11-1935  REASON FOR VISIT: follow up HISTORY FROM: patient  Chief Complaint  Patient presents with  . Follow-up    memory, rm 1, with wife      HISTORY OF PRESENT ILLNESS: Today 09/25/19 Dylan [name] is a 84 y.o. male here today for follow up for AD and cerebral angiopathy. He continues Namenda 29m twice daily and Exelon patch 9.565mdaily. He also continues CPAP nightly. He is followed by psychiatry with [facility] and was last seen in 04/2019. He has more difficulty with conversation. He is repetitive in speech. He seems more confused at night. He does have some delusional thoughts. He often asks about his children. He thinks that he puts things in one place but his wife knows that he has not. He often asks to go home. He will pack his belongings and ask his wife to take him home. She denies any behavior concerns. He is able to do most ADL's with minimal assistance. Does require assistance with dressing. Not able to manage finances. He does not drive. He seems happy most of the time. No falls or changes in appetite. He has been under more stress recently. He is currently a defendant in a lawsuit. He has been summonsed for deposition by plaintiff's attorney.   T [name] with [employer] is patients attorney.   HISTORY: (copied from my note on 12/07/2018)  ChMARIBEL LUISs a [age].o. male here today for follow up for memory loss.  He continues Namenda 10 mg twice daily as well as Exelon patch 9.5 mg daily.  He is tolerating medications well without obvious adverse effects.  He feels that he is doing fairly well on this regimen.  He presents today with his wife who agrees.  She reports that memory does wax and wane.  He frequently misplaces things.  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  She reports that he does fairly well with this.  He is able to mobilize without difficulty.  She denies falls.  He is able  to perform ADLs at home without assistance.  HISTORY: (copied from [name] on 10/11/2017)  Interval history 10/11/2017:Patient is here for follow-up on late onset Alzheimer's disease and cerebral amyloid angiopathy. He has a past medical history of obstructive sleep apnea, hyperlipidemia, Alzheimer's. [facility] and he also had formal neurocognitive testing recently that confirmed Alzheimer's disease. [facility], [place] was 24 out of 30 and is stable from testing approximately 1 year ago. He continues to do very well and therefore remains in MCI classification. Patient to continue with resting my patch at this time. 04/12/2017 his MMSE was 20/30, He is compliant with sleep apnea. He has declined, needs more help with functioning. He has stopped driving. Rivastigmine was increased to 9.5. Avoid ASA and NSAIDs. Also on Namenda. Reviewed [name] with patient and wife, they have already met with her. He is here with his lovely wife. Wife says things are going well, testing reveals mild Alzheimers. He is doing well. Discussed diet, managing vascular risk factors, exercise.   Formal neurocognitive testing [name], most likely due to Alzheimer's disease, without behavioral disturbance.  Recommendations/Plan: Based on the findings of the present evaluation, the following recommendations are offered:  1.Continue Namenda and Exelon 2. Continue CPAP 3. Continue healthy nutritional habits 4.  Increase physical activity 5. Have regular routine and structure in daily life to maximize cognitive functioning 6. Continue social interaction and mental stimulation 7. 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 Wife should continue to monitor medications and finances to ensure errors are not made secondary to memory deficits 9.  Education and support for the patient's wife will likely be beneficial. She was provided with a packet of information that includes local caregiver resources. She may wish to consider attending a local dementia caregiver support group.  10. I agree it is best for him to not return to driving.   REVIEW OF SYSTEMS: Out of a complete 14 system review of symptoms, the patient complains only of the following symptoms, memory loss and all other reviewed systems are negative.  ALLERGIES: No Known Allergies  HOME MEDICATIONS: Outpatient Medications Prior to Visit  Medication Sig Dispense Refill  . B Complex-C-Folic Acid (SUPER B COMPLEX/FA/VIT C PO) Take 1 tablet by mouth daily.    Dylan [name] doxycycline (VIBRA-TABS) 100 MG tablet Take 100 mg by mouth 2 (two) times daily.    . fluorouracil (EFUDEX) 5 % cream 1 APPLICATION APPLY TO THE SCALP NIGHTLY APPLY TO SCALP NIGHTLY FOR14 DAYS  0  . ketoconazole (NIZORAL) 2 % cream Apply 1 application 2 (two) times daily topically.    . memantine (NAMENDA) 10 MG tablet TAKE 1 TABLET BY MOUTH TWICE A DAY 180 tablet 2  . pentosan polysulfate (ELMIRON) 100 MG capsule Take 100 mg by mouth 3 (three) times daily.     . potassium citrate (UROCIT-K) 10 MEQ (1080 MG) SR tablet Take 2 tablets by mouth 2 (two) times daily.   11  . rivastigmine (EXELON) 9.5 mg/24hr Place 9.5 mg onto the skin daily.    . sertraline (ZOLOFT) 50 MG tablet Take 100 mg by mouth daily.     . simvastatin (ZOCOR) 80 MG tablet Take 80 mg by mouth at bedtime.      . tamsulosin (FLOMAX) 0.4 MG CAPS capsule Take 0.4 mg by mouth daily.  11  . Turmeric (QC TUMERIC COMPLEX) 500 MG CAPS Take 1 capsule by mouth daily.    . ranitidine (ZANTAC) 75 MG tablet Take 75 mg by mouth 2 (two) times daily as needed.      No facility-administered medications prior to visit.    PAST MEDICAL HISTORY: Past Medical History:  Diagnosis Date  . High cholesterol   . Kidney stone   . Memory loss   . Sleep apnea    NPSG  08-03-07 AHI 10/hr, RDI 19.7/hr    PAST SURGICAL HISTORY: Past Surgical History:  Procedure Laterality Date  . right clavicle repair  1954   fx  . [place]  . WISDOM TOOTH EXTRACTION  1961    FAMILY HISTORY: Family History  Problem Relation Age of Onset  . Heart disease Father   . Stroke Father   . Cancer Mother   . Pulmonary embolism Mother     SOCIAL HISTORY: Social History   Socioeconomic History  . Marital status: Married    Spouse name: [name]   . Number of children: 3  . Years of education: 12+  . Highest education level: Not on file  Occupational History  . Occupation: Real Estate-rental properties  Tobacco Use  . Smoking status: Never Smoker  . Smokeless tobacco: Never Used  Substance and Sexual Activity  . Alcohol use: Yes    Alcohol/week: 1.0 standard drinks  Types: 1 Glasses of wine per week    Comment: Socially; not weekly use  . Drug use: No  . Sexual activity: Not on file  Other Topics Concern  . Not on file  Social History Narrative   Lives at home with wife, [name].   Caffeine use: 2 cups of tea daily   Right handed   Social Determinants of Health   Financial Resource Strain:   . Difficulty of Paying Living Expenses:   Food Insecurity:   . Worried About Charity fundraiser in the Last Year:   . Arboriculturist in the Last Year:   Transportation Needs:   . Film/video editor (Medical):   Dylan [name] Lack of Transportation (Non-Medical):   Physical Activity:   . Days of Exercise per Week:   . Minutes of Exercise per Session:   Stress:   . Feeling of Stress :   Social Connections:   . Frequency of Communication with Friends and Family:   . Frequency of Social Gatherings with Friends and Family:   . Attends Religious Services:   . Active Member of Clubs or Organizations:   . Attends Archivist Meetings:   Dylan [name] Marital Status:   Intimate Partner Violence:   . Fear of Current or Ex-Partner:   . Emotionally Abused:   Dylan [name]  Physically Abused:   . Sexually Abused:       PHYSICAL EXAM  Vitals:   09/25/19 1250  BP: 137/77  Pulse: 79  Temp: (!) 97 F (36.1 C)  Weight: 180 lb (81.6 kg)  Height: '5\' 10"'  (1.778 m)   Body mass index is 25.83 kg/m.  Generalized: Well developed, in no acute distress  Cardiology: normal rate and rhythm, no murmur noted Respiratory: clear to auscultation bilaterally  Neurological examination  Mentation: Alert,  Oriented to month and season but not oriented to year, or day of the week, he is oriented to place, able to assist in minimal history taking, wife provides most of history. Follows all commands speech and language fluent Cranial nerve II-XII: Pupils were equal round reactive to light. Extraocular movements were full, visual field were full on confrontational test. Facial sensation and strength were normal. Uvula tongue midline. Head turning and shoulder shrug  were normal and symmetric. Motor: The motor testing reveals 5 over 5 strength of all 4 extremities. Good symmetric motor tone is noted throughout.  Sensory: Sensory testing is intact to soft touch on all 4 extremities. No evidence of extinction is noted.  Gait and station: Gait is normal.    DIAGNOSTIC DATA (LABS, IMAGING, TESTING) - I reviewed patient records, labs, notes, testing and imaging myself where available.  MMSE - Mini Mental State Exam 09/25/2019 12/07/2018 03/09/2017  Not completed: - (No Data) -  Orientation to time 2 0 4  Orientation to Place '5 3 5  ' Registration '3 3 3  ' Attention/ Calculation 0 5 4  Recall 0 0 0  Language- name 2 objects '2 2 2  ' Language- repeat '1 1 1  ' Language- follow 3 step command '2 3 3  ' Language- read & follow direction 1 0 1  Write a sentence '1 1 1  ' Copy design '1 1 1  ' Copy design-comments 5 animals - -  Total score '18 19 25     ' Lab Results  Component Value Date   WBC 6.3 03/09/2017   HGB 15.9 03/09/2017   HCT 48.5 03/09/2017   MCV 93 03/09/2017   PLT 152 03/09/2017  Component Value Date/Time   NA 145 (H) 03/09/2017 1046   K 4.0 03/09/2017 1046   CL 105 03/09/2017 1046   CO2 26 03/09/2017 1046   GLUCOSE 105 (H) 03/09/2017 1046   BUN 22 03/09/2017 1046   CREATININE 1.17 03/09/2017 1046   CREATININE 1.07 02/22/2015 1614   CALCIUM 9.7 03/09/2017 1046   PROT 7.1 03/09/2017 1046   ALBUMIN 4.7 03/09/2017 1046   AST 18 03/09/2017 1046   ALT 17 03/09/2017 1046   ALKPHOS 64 03/09/2017 1046   BILITOT 0.4 03/09/2017 1046   GFRNONAA 59 (L) 03/09/2017 1046   GFRAA 68 03/09/2017 1046   No results found for: CHOL, HDL, LDLCALC, LDLDIRECT, TRIG, CHOLHDL No results found for: HGBA1C Lab Results  Component Value Date   VITAMINB12 360 01/22/2015   Lab Results  Component Value Date   TSH 2.360 01/22/2015     ASSESSMENT AND PLAN 84 y.o. year old male  has a past medical history of High cholesterol, Kidney stone, Memory loss, and Sleep apnea. here with     ICD-10-CM   1. Late onset Alzheimer's disease without behavioral disturbance (HCC)  G30.1    F02.80   2. Cerebral amyloid angiopathy ([place])  E85.4    I68.0     Mr Deutschman is doing fairly well. Memory is stable on Namenda and Exelon. MMSE is 18/30 and consistent with previous reading. We will continue current treatment plan. He was advised to continue close follow up with psychiatry at [facility]. I do not feel patient is competent to participate in deposition with the level of memory loss he has. I would not feel that information provided by patient could be relayed or used as valid or accurate data. We will provide a letter to patient's attorney stating this opinion per his wife's request. He was encouraged to stay physically and mentally active. He will stay well hydrated. Follow up closely with PCP for stroke prevention and management of co morbidities advised. He will follow up with me in 6 months. His wife verbalizes understanding and agreement with this plan.    No orders of the defined types  were placed in this encounter.    No orders of the defined types were placed in this encounter.     I spent 30 minutes with the patient. 50% of this time was spent counseling and educating patient on plan of care and medications.    Debbora Presto, FNP-C 09/25/2019, 3:35 PM [facility] [address], [place], [place] [address] [phone]  Made any corrections needed, and agree with history, physical, neuro exam,assessment and plan as stated.     Sarina Ill, MD [facility]

## 2019-09-28 ENCOUNTER — Telehealth: Payer: Self-pay | Admitting: *Deleted

## 2019-09-28 ENCOUNTER — Encounter: Payer: Self-pay | Admitting: *Deleted

## 2019-09-28 NOTE — Telephone Encounter (Signed)
I called wife and she will come by and pick up, up front. (2 letters).

## 2019-09-28 NOTE — Telephone Encounter (Signed)
-----   Message from Debbora Presto, NP sent at 09/27/2019  7:22 AM EDT ----- Terence Lux! Can you please get this typed up in a letter to T. Enid Derry, attorney representing Mr [place]. I will sign it. We need two copies. One sealed in an envelope and one for his wife. She will be able to pick up whenever it is ready.   TY ----- Message ----- From: Melvenia Beam, MD Sent: 09/25/2019   8:12 PM EDT To: Debbora Presto, NP  Something like this?   Patient's history and physical demonstrates substantial and measurable cognitive losses consistent with dementia. Based on the prior experiences in the community and the substantial degree of impairment is clear that he  does not have the capacity to make informed and appropriate decisions on his  healthcare and finances or participate in legal proceedings. It is also clear that patient does not comprehend the degree of cognitive losses. On this basis I feel that this patient, who is suffering from substantial cognitive impairment due to dementia, does not have the capacity to participate in legal proceedings.    ----- Message ----- From: Debbora Presto, NP Sent: 09/25/2019   3:36 PM EDT To: Melvenia Beam, MD  Hey Dr Jaynee Eagles! Do you happen to have any documented templates or verbiage for a letter stating that a patient is not mentally competent to participate in a court procedure like a deposition? Sandi Mariscal is being sued by someone and I do not feel he is able to give accurate or reliable information. I will write something to his attorney stating this but was curious if I needed to use specific language or include specific information as to why is is not competent. Thank you for your help!

## 2019-11-17 DIAGNOSIS — G301 Alzheimer's disease with late onset: Secondary | ICD-10-CM | POA: Diagnosis not present

## 2019-11-17 DIAGNOSIS — Z9181 History of falling: Secondary | ICD-10-CM | POA: Diagnosis not present

## 2019-11-17 DIAGNOSIS — F0280 Dementia in other diseases classified elsewhere without behavioral disturbance: Secondary | ICD-10-CM | POA: Diagnosis not present

## 2019-12-11 ENCOUNTER — Ambulatory Visit: Payer: PPO | Admitting: Family Medicine

## 2020-01-18 ENCOUNTER — Telehealth: Payer: Self-pay | Admitting: Family Medicine

## 2020-01-18 NOTE — Telephone Encounter (Signed)
No we haven't gotten any letters.

## 2020-01-18 NOTE — Telephone Encounter (Signed)
I spoke to wife of pt who was at attorney's office as we spoke.  She stated the card / letter was asking if [name] would do a second letter (second opinion) about Mr. Dylan [name] relating to his cognition status). Amy, NP did letter back in 09-25-19, but attorney is asking for another letter.  (litigation with a tenant).

## 2020-01-18 NOTE — Telephone Encounter (Signed)
[name],Dylan(wife to pt) is asking for a response to letter she brought to office for Dr Jaynee Eagles on the morning of 08-16.  Please call

## 2020-01-19 NOTE — Telephone Encounter (Signed)
Dylan [name], I am happy to, can you ask them what exactly they need?

## 2020-01-22 ENCOUNTER — Encounter: Payer: Self-pay | Admitting: *Deleted

## 2020-01-22 NOTE — Telephone Encounter (Signed)
He is currently a defendant in a lawsuit. He has been summoned for deposition by plaintiff's attorney.  There attorney is asking for second opinion relating to pts cognition to be able to do this.  AmyNP saw pt 09-25-19 and letter was written at that time.

## 2020-01-22 NOTE — Telephone Encounter (Signed)
It is at Dylan [name]'s desk to sign.

## 2020-01-22 NOTE — Telephone Encounter (Signed)
Yes I can write exactly what Amy said, she actually got that from my note. Do you mind composing a letter from me exactly as Amy's letter? thanks

## 2020-01-23 NOTE — Telephone Encounter (Addendum)
LMVM for wife, that letter is ready and can pick up front.  I placed up front in file.

## 2020-02-16 DIAGNOSIS — H0288B Meibomian gland dysfunction left eye, upper and lower eyelids: Secondary | ICD-10-CM | POA: Diagnosis not present

## 2020-02-16 DIAGNOSIS — H0288A Meibomian gland dysfunction right eye, upper and lower eyelids: Secondary | ICD-10-CM | POA: Diagnosis not present

## 2020-02-16 DIAGNOSIS — H40013 Open angle with borderline findings, low risk, bilateral: Secondary | ICD-10-CM | POA: Diagnosis not present

## 2020-02-16 DIAGNOSIS — H04123 Dry eye syndrome of bilateral lacrimal glands: Secondary | ICD-10-CM | POA: Diagnosis not present

## 2020-02-16 DIAGNOSIS — H0102B Squamous blepharitis left eye, upper and lower eyelids: Secondary | ICD-10-CM | POA: Diagnosis not present

## 2020-02-16 DIAGNOSIS — H0102A Squamous blepharitis right eye, upper and lower eyelids: Secondary | ICD-10-CM | POA: Diagnosis not present

## 2020-02-16 DIAGNOSIS — D492 Neoplasm of unspecified behavior of bone, soft tissue, and skin: Secondary | ICD-10-CM | POA: Diagnosis not present

## 2020-03-02 ENCOUNTER — Encounter: Payer: Self-pay | Admitting: Internal Medicine

## 2020-03-07 DIAGNOSIS — R7302 Impaired glucose tolerance (oral): Secondary | ICD-10-CM | POA: Diagnosis not present

## 2020-03-07 DIAGNOSIS — N401 Enlarged prostate with lower urinary tract symptoms: Secondary | ICD-10-CM | POA: Diagnosis not present

## 2020-03-07 DIAGNOSIS — H9190 Unspecified hearing loss, unspecified ear: Secondary | ICD-10-CM | POA: Diagnosis not present

## 2020-03-07 DIAGNOSIS — Z23 Encounter for immunization: Secondary | ICD-10-CM | POA: Diagnosis not present

## 2020-03-07 DIAGNOSIS — R972 Elevated prostate specific antigen [PSA]: Secondary | ICD-10-CM | POA: Diagnosis not present

## 2020-03-07 DIAGNOSIS — D692 Other nonthrombocytopenic purpura: Secondary | ICD-10-CM | POA: Diagnosis not present

## 2020-03-07 DIAGNOSIS — F0390 Unspecified dementia without behavioral disturbance: Secondary | ICD-10-CM | POA: Diagnosis not present

## 2020-03-07 DIAGNOSIS — G4733 Obstructive sleep apnea (adult) (pediatric): Secondary | ICD-10-CM | POA: Diagnosis not present

## 2020-03-07 DIAGNOSIS — N3010 Interstitial cystitis (chronic) without hematuria: Secondary | ICD-10-CM | POA: Diagnosis not present

## 2020-03-07 DIAGNOSIS — K219 Gastro-esophageal reflux disease without esophagitis: Secondary | ICD-10-CM | POA: Diagnosis not present

## 2020-03-07 DIAGNOSIS — E7800 Pure hypercholesterolemia, unspecified: Secondary | ICD-10-CM | POA: Diagnosis not present

## 2020-03-07 DIAGNOSIS — F419 Anxiety disorder, unspecified: Secondary | ICD-10-CM | POA: Diagnosis not present

## 2020-03-26 ENCOUNTER — Other Ambulatory Visit: Payer: Self-pay

## 2020-03-26 ENCOUNTER — Ambulatory Visit: Payer: PPO | Admitting: Family Medicine

## 2020-03-26 ENCOUNTER — Encounter: Payer: Self-pay | Admitting: Family Medicine

## 2020-03-26 VITALS — BP 125/73 | HR 70 | Ht 69.0 in | Wt 178.0 lb

## 2020-03-26 DIAGNOSIS — I680 Cerebral amyloid angiopathy: Secondary | ICD-10-CM | POA: Diagnosis not present

## 2020-03-26 DIAGNOSIS — F0280 Dementia in other diseases classified elsewhere without behavioral disturbance: Secondary | ICD-10-CM | POA: Diagnosis not present

## 2020-03-26 DIAGNOSIS — E854 Organ-limited amyloidosis: Secondary | ICD-10-CM | POA: Diagnosis not present

## 2020-03-26 DIAGNOSIS — G301 Alzheimer's disease with late onset: Secondary | ICD-10-CM | POA: Diagnosis not present

## 2020-03-26 NOTE — Progress Notes (Addendum)
Chief Complaint  Patient presents with  . Follow-up    rm 1  . Alzheimer's     HISTORY OF PRESENT ILLNESS: Today 03/26/20  Dylan [name] is a 84 y.o. male here today for follow up for AD and cerebral angiopathy. He continues memantine 59m BID, rivastigmine patch 9.559mdaily. He is also taking sertraline 1008maily for anxiety and depression. [facility] Dr JohWynetta Emeryn 10/2019. PT/OT ordered and medications continued.  He did not pursue PT/OT as gait and imbalance improved.  He presents today with his wife who reports that symptoms are fairly stable.  He does seem to be a little slower with getting dressed and taking showers.  There was an episode in September where he got lost walking to his daughter's house.  He was gone for about 5 hours.  Mrs. WesLichtmans able to contact police who helped locate him using GPS on his phone.  They have ensured that he has someone with him now when he walks.  He continues to walk every day.  He is getting anywhere from 7000-10,000 steps a day.  He has a good appetite.  He is sleeping well.  He continues to enjoy going places.  He is now going to [facility] twice weekly for memory loss group.  He tells me that yesterday he was asked to show his group members how he can dance.  He and his wife performed a short shag dance.  Mrs. WesAzerbaijanports that while she has been settled.  Schipper was not required to participate in deposition.   HISTORY (copied from my note on 09/25/2019)  ChaZAIVION [name] a 84 14o. male here today for follow up for AD and cerebral angiopathy. He continues Namenda 8m19mice daily and Exelon patch 9.5mg 31mly. He also continues CPAP nightly. He is followed by [facility]ry with [facility] and was last seen in 04/2019. He has more difficulty with conversation. He is repetitive in speech. He seems more confused at night. He does have some delusional thoughts. He often asks about his children. He thinks that he puts things in  one place but his wife knows that he has not. He often asks to go home. He will pack his belongings and ask his wife to take him home. She denies any behavior concerns. He is able to do most ADL's with minimal assistance. Does require assistance with dressing. Not able to manage finances. He does not drive. He seems happy most of the time. No falls or changes in appetite. He has been under more stress recently. He is currently a defendant in a lawsuit. He has been summonsed for deposition by plaintiff's attorney.   T KeiSerafina [name] with Dylan [name] is patients attorney.   HISTORY: (copied from my note on 12/07/2018)  Dylan [name] y.36malehere today for follow up for memory loss.He continues Namenda 10 mg twice daily as well as Exelon patch 9.5 mg daily. He is tolerating medications well without obvious adverse effects. He feels that he is doing fairly well on this regimen. He presents today with his wife who agrees. She reports that memory does wax and wane. He frequently misplaces things. 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 She reports that he does fairly well with this. He is able to mobilize without difficulty. She denies falls. He is able to perform ADLs at home without assistance.  HISTORY: (copied fromDr Ahern'snote on  10/11/2017)  Interval history 10/11/2017:Patient is here for follow-up on late onset Alzheimer's disease and cerebral amyloid angiopathy. He has a past medical history of obstructive sleep apnea, hyperlipidemia, Alzheimer's. He was recently seen at the [facility] memory disorders clinic and he also had formal neurocognitive testing recently that confirmed Alzheimer's disease. Reviewed records from [place]. Repeat MRI was ordered, [place] was 24 out of 30 and is stable from testing approximately 1 year ago. He continues to do very well and therefore remains in MCI classification. Patient to continue with resting my patch  at this time. 04/12/2017 his MMSE was 20/30, He is compliant with sleep apnea. He has declined, needs more help with functioning. He has stopped driving. Rivastigmine was increased to 9.5. Avoid ASA and NSAIDs. Also on Namenda. Reviewed [name] report with patient and wife, they have already met with her. He is here with his lovely wife. Wife says things are going well, testing reveals mild Alzheimers. He is doing well. Discussed diet, managing vascular risk factors, exercise.   Formal neurocognitive testing Dr. Kathreen Cosier dementia, most likely due to Alzheimer's disease, without behavioral disturbance.  Recommendations/Plan: Based on the findings of the present evaluation, the following recommendations are offered:  1.Continue Namenda and Exelon 2. Continue CPAP 3. Continue healthy nutritional habits 4. Increase physical activity 5. Have regular routine and structure in daily life to maximize cognitive functioning 6. Continue social interaction and mental stimulation 7. 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 Wife should continue to monitor medications and finances to ensure errors are not made secondary to memory deficits 9. Education and support for the patient's wife will likely be beneficial. She was provided with a packet of information that includes local caregiver resources. She may wish to consider attending a local dementia caregiver support group.  10. I agree it is best for him to not return to driving.    REVIEW OF SYSTEMS: Out of a complete 14 system review of symptoms, the patient complains only of the following symptoms, memory loss, depression and all other reviewed systems are negative.   ALLERGIES: No Known Allergies   HOME MEDICATIONS: Outpatient Medications Prior to Visit  Medication Sig Dispense Refill  . B Complex-C-Folic Acid (SUPER B COMPLEX/FA/VIT C PO) Take 1 tablet by mouth daily.    Marland Kitchen  doxycycline (VIBRA-TABS) 100 MG tablet Take 100 mg by mouth 2 (two) times daily.    . fluorouracil (EFUDEX) 5 % cream 1 APPLICATION APPLY TO THE SCALP NIGHTLY APPLY TO SCALP NIGHTLY FOR14 DAYS  0  . ketoconazole (NIZORAL) 2 % cream Apply 1 application 2 (two) times daily topically.    . memantine (NAMENDA) 10 MG tablet TAKE 1 TABLET BY MOUTH TWICE A DAY 180 tablet 2  . pentosan polysulfate (ELMIRON) 100 MG capsule Take 100 mg by mouth 3 (three) times daily.     . potassium citrate (UROCIT-K) 10 MEQ (1080 MG) SR tablet Take 2 tablets by mouth 2 (two) times daily.   11  . rivastigmine (EXELON) 9.5 mg/24hr Place 9.5 mg onto the skin daily.    . sertraline (ZOLOFT) 50 MG tablet Take 100 mg by mouth daily.     . simvastatin (ZOCOR) 80 MG tablet Take 80 mg by mouth at bedtime.      . tamsulosin (FLOMAX) 0.4 MG CAPS capsule Take 0.4 mg by mouth daily.  11  . Turmeric (QC TUMERIC COMPLEX) 500 MG CAPS Take 1 capsule by mouth daily.  No facility-administered medications prior to visit.     PAST MEDICAL HISTORY: Past Medical History:  Diagnosis Date  . High cholesterol   . Kidney stone   . Memory loss   . Sleep apnea    NPSG 08-03-07 AHI 10/hr, RDI 19.7/hr     PAST SURGICAL HISTORY: Past Surgical History:  Procedure Laterality Date  . right clavicle repair  1954   fx  . [place]  . WISDOM TOOTH EXTRACTION  1961     FAMILY HISTORY: Family History  Problem Relation Age of Onset  . Heart disease Father   . Stroke Father   . Cancer Mother   . Pulmonary embolism Mother      SOCIAL HISTORY: Social History   Socioeconomic History  . Marital status: Married    Spouse name: [name]   . Number of children: 3  . Years of education: 12+  . Highest education level: Not on file  Occupational History  . Occupation: Real Estate-rental properties  Tobacco Use  . Smoking status: Never Smoker  . Smokeless tobacco: Never Used  Vaping Use  . Vaping Use: Never used  Substance  and Sexual Activity  . Alcohol use: Yes    Alcohol/week: 1.0 standard drink    Types: 1 Glasses of wine per week    Comment: Socially; not weekly use  . Drug use: No  . Sexual activity: Not on file  Other Topics Concern  . Not on file  Social History Narrative   Lives at home with wife, [name].   Caffeine use: 2 cups of tea daily   Right handed   Social Determinants of Health   Financial Resource Strain:   . Difficulty of Paying Living Expenses: Not on file  Food Insecurity:   . Worried About Charity fundraiser in the Last Year: Not on file  . Ran Out of Food in the Last Year: Not on file  Transportation Needs:   . Lack of Transportation (Medical): Not on file  . Lack of Transportation (Non-Medical): Not on file  Physical Activity:   . Days of Exercise per Week: Not on file  . Minutes of Exercise per Session: Not on file  Stress:   . Feeling of Stress : Not on file  Social Connections:   . Frequency of Communication with Friends and Family: Not on file  . Frequency of Social Gatherings with Friends and Family: Not on file  . Attends Religious Services: Not on file  . Active Member of Clubs or Organizations: Not on file  . Attends Archivist Meetings: Not on file  . Marital Status: Not on file  Intimate Partner Violence:   . Fear of Current or Ex-Partner: Not on file  . Emotionally Abused: Not on file  . Physically Abused: Not on file  . Sexually Abused: Not on file      PHYSICAL EXAM  Vitals:   03/26/20 1303  BP: 125/73  Pulse: 70  Weight: 178 lb (80.7 kg)  Height: '5\' 9"'  (1.753 m)   Body mass index is 26.29 kg/m.   Generalized: Well developed, in no acute distress   Neurological examination  Mentation: Alert, he is not oriented to time, but he is oriented to place and most history taking. Follows all commands speech and language fluent Cranial nerve II-XII: Pupils were equal round reactive to light. Extraocular movements were full, visual field  were full on confrontational test. Facial sensation and strength were normal. Head turning and  shoulder shrug  were normal and symmetric. Motor: The motor testing reveals 5 over 5 strength of all 4 extremities. Good symmetric motor tone is noted throughout.  Gait and station: Gait is normal.     DIAGNOSTIC DATA (LABS, IMAGING, TESTING) - I reviewed patient records, labs, notes, testing and imaging myself where available.  Lab Results  Component Value Date   WBC 6.3 03/09/2017   HGB 15.9 03/09/2017   HCT 48.5 03/09/2017   MCV 93 03/09/2017   PLT 152 03/09/2017      Component Value Date/Time   NA 145 (H) 03/09/2017 1046   K 4.0 03/09/2017 1046   CL 105 03/09/2017 1046   CO2 26 03/09/2017 1046   GLUCOSE 105 (H) 03/09/2017 1046   BUN 22 03/09/2017 1046   CREATININE 1.17 03/09/2017 1046   CREATININE 1.07 02/22/2015 1614   CALCIUM 9.7 03/09/2017 1046   PROT 7.1 03/09/2017 1046   ALBUMIN 4.7 03/09/2017 1046   AST 18 03/09/2017 1046   ALT 17 03/09/2017 1046   ALKPHOS 64 03/09/2017 1046   BILITOT 0.4 03/09/2017 1046   GFRNONAA 59 (L) 03/09/2017 1046   GFRAA 68 03/09/2017 1046   No results found for: CHOL, HDL, LDLCALC, LDLDIRECT, TRIG, CHOLHDL No results found for: HGBA1C Lab Results  Component Value Date   VITAMINB12 360 01/22/2015   Lab Results  Component Value Date   TSH 2.360 01/22/2015      ASSESSMENT AND PLAN  84 y.o. year old male  has a past medical history of High cholesterol, Kidney stone, Memory loss, and Sleep apnea. here with   Late onset Alzheimer's disease without behavioral disturbance ([place])  Cerebral amyloid angiopathy ([place])  Skipper continues to do fairly well.  Memory loss is stable.  He feels that mood is well managed on sertraline.  We will continue current treatment plan.  We have reviewed recommendations for regular physical and mental activity.  He was encouraged to stay active with his wife.  Well-balanced diet encouraged.  Appropriate  hydration reviewed.  He will continue close follow-up with primary care and [facility] [facility] as advised.  He may continue refills through Dr. Osborne Casco or Dr. Wynetta Emery.  May follow-up with [place] as needed.  He and his wife both verbalized understanding and agreement with this plan.  I spent 20 minutes of face-to-face and non-face-to-face time with patient.  This included previsit chart review, lab review, study review, order entry, electronic health record documentation, patient education.    Debbora Presto, MSN, FNP-C 03/26/2020, 2:02 PM  [facility] [address], [place], [place] [address] [phone]  Made any corrections needed, and agree with history, physical, neuro exam,assessment and plan as stated.     Sarina Ill, MD [facility]

## 2020-03-26 NOTE — Patient Instructions (Addendum)
We will continue current treatment plan. Continue follow up with [facility].    May follow up with [place] as needed if PCP/[facility] willing to refill Namenda. Follow up with [place] as needed    Memory Compensation Strategies  1. Use "WARM" strategy.  W= write it down  A= associate it  R= repeat it  M= make a mental note  2.   You can keep a Social worker.  Use a 3-ring notebook with sections for the following: calendar, important names and phone numbers,  medications, doctors' names/phone numbers, lists/reminders, and a section to journal what you did  each day.   3.    Use a calendar to write appointments down.  4.    Write yourself a schedule for the day.  This can be placed on the calendar or in a separate section of the Memory Notebook.  Keeping a  regular schedule can help memory.  5.    Use medication organizer with sections for each day or morning/evening pills.  You may need help loading it  6.    Keep a basket, or pegboard by the door.  Place items that you need to take out with you in the basket or on the pegboard.  You may also want to  include a message board for reminders.  7.    Use sticky notes.  Place sticky notes with reminders in a place where the task is performed.  For example: " turn off the  stove" placed by the stove, "lock the door" placed on the door at eye level, " take your medications" on  the bathroom mirror or by the place where you normally take your medications.  8.    Use alarms/timers.  Use while cooking to remind yourself to check on food or as a reminder to take your medicine, or as a  reminder to make a call, or as a reminder to perform another task, etc.   Alzheimer Disease Caregiver Guide  Alzheimer disease causes a person to lose the ability to remember things and make decisions. A person who has Alzheimer disease may not be able to take care of himself or herself. He or she may need help with simple tasks. The tips below can help you care  for the person. What kind of changes does this condition cause? This condition makes a person:  Forget things.  Feel confused.  Act differently.  Have different moods. These things get worse with time. Tips to help with symptoms  Be calm and patient.  Respond with a simple, short answer.  Avoid correcting the person in a negative way.  Try not to take things personally, even if the person forgets your name.  Do not argue with the person. This may make the person more upset. Tips to lessen frustration  Make appointments and do daily tasks when the person is at his or her best.  Take your time. Simple tasks may take longer. Allow plenty of time to complete tasks.  Limit choices for the person.  Involve the person in what you are doing.  Keep a daily routine.  Avoid new or crowded places, if possible.  Use simple words, short sentences, and a calm voice. Only give one direction at a time.  Buy clothes and shoes that are easy to put on and take off.  Organize medicines in a pillbox for each day of the week.  Keep a calendar in a central location to remind the person of meetings  or other activities.  Let people help if they offer. Take a break when needed. Tips to prevent injury  Keep floors clear. Remove rugs, magazine racks, and floor lamps.  Keep hallways well-lit.  Put a handrail and non-slip mat in the bathtub or shower.  Put childproof locks on cabinets that have dangerous items in them. These items include medicine, alcohol, guns, toxic cleaning items, sharp tools, matches, and lighters.  Put locks on doors where the person cannot see or reach them. This helps the person to not wander out of the house and get lost.  Be prepared for emergencies. Keep a list of emergency phone numbers and addresses close by.  Bracelets may be worn that track location and identify the person as having memory problems. This should be worn at all times for safety. Tips for  the future  Discuss financial and legal planning early. People with this disease have trouble managing their money as the disease gets worse. Get help from a professional.  Talk about advance directives, safety, and daily care. Take these steps: ? Create a living will and choose a power of attorney. This is someone who can make decisions for the person with Alzheimer disease when he or she can no longer do so. ? Discuss driving safety and when to stop driving. The person's doctor can help with this. ? If the person lives alone, make sure he or she is safe. Some people need extra help at home. Other people need more care at a nursing home or care center. Where to find support You can find support by joining a support group near you. Some benefits of joining a support group include:  Learning ways to manage stress.  Sharing experiences with others.  Getting emotional comfort and support.  Learning about caregiving as the disease progresses.  Knowing what community resources are available and making use of them. Where to find more information  Alzheimer's Association: [employer] Contact a doctor if:  The person has a fever.  The person has a sudden behavior change that does not get better with calming strategies.  The person is not able to take care of himself or herself at home.  The person threatens you or anyone else, including himself or herself.  You are no longer able to care for the person. Summary  Alzheimer disease causes a person to forget things and to be confused.  A person who has this condition may not be able to take care of himself or herself.  Take steps to keep the person from getting hurt. Plan for future care.  You can find support by joining a support group near you. This information is not intended to replace advice given to you by your health care provider. Make sure you discuss any questions you have with your health care provider. Document Revised:  09/06/2018 Document Reviewed: 05/13/2017 Elsevier Patient Education  2020 [employer].

## 2020-04-02 NOTE — Progress Notes (Signed)
Subjective:    Patient ID: Dylan [name], male    DOB: [birth year], 84 y.o.   MRN: [id number]  HPI M Never smoker followed for OSA, Restless Legs, hx allergy.  -----------------------------------------------------------------------------------------   04/04/2019- 84 year old male never smoker followed for OSA, Restless Legs, history allergic rhinitis, complicated by GERD, Alzheimer's/ cerebral amyloid angiopathy,  CPAP auto 5-15/[employer]pt Followed by Neurology for Alzheimer's. -----1 year follow up OSA Download compliance 50%, with little use since mid-October. AHI then was 0.8/ hr Having trouble with humidifier. Wife here, but she has deferred CPAP issues to him and with his Alzheimers, I'm not sure about comprehension. Apparently humidifier not running dry.  Denies breathing discomfort. UTD flu vax.  Wife notes some coughing with meals, suggesting LPR- to f/u with PCP.   04/03/20- 84 year old male never smoker followed for OSA, Restless Legs, history allergic rhinitis, complicated by GERD, Alzheimer's/ cerebral amyloid angiopathy,  CPAP auto 5-15/[employer]   Replaced 2019 Followed by Neurology for Alzheimer's Download- Body weight today- Covid vax- 2 Moderna Flu vax- had -----pt wife states machine makes a noise pt takes it off at night due to memory problems. Wife relates that daughter heard machine make "screeching noise", but only once. I reviewed AirView download with wife so she would be aware of frequent missed nights. Wife does virtually all the talking.  ROS-see HPI    + = positive Constitutional:   No-   weight loss, night sweats, fevers, chills, fatigue, lassitude. HEENT:   No-  headaches, difficulty swallowing, tooth/dental problems, sore throat,       No-  sneezing, itching, ear ache, nasal congestion, post nasal drip,  CV:  No-   chest pain, orthopnea, PND, swelling in lower extremities, anasarca, dizziness, palpitations Resp:-  shortness of breath with exertion or at rest.               No-   productive cough,   non-productive cough,  No- coughing up of blood.              No-   change in color of mucus.   wheezing.   Skin: No-   rash or lesions. GI:  +  heartburn, no-indigestion, abdominal pain, nausea, vomiting,  GU:  MS:  No-   joint pain or swelling.  Neuro-     Per HPI. + Memory loss is progressive. Psych:  No- change in mood or affect. No depression or anxiety.  No memory loss.  OBJ- Physical Exam General- Alert, Oriented, Affect-appropriate, Distress- none acute, +soft-spoken, Skin- rash-none, lesions- none, excoriation- none Lymphadenopathy- none Head- atraumatic            Eyes- Gross vision intact, PERRLA, conjunctivae and secretions clear            Ears- grossly normal hearing            Nose- Clear, no-Septal dev, mucus, polyps, erosion, perforation             Throat- Mallampati II-III , mucosa-normal, drainage- none, tonsils- atrophic Neck- flexible , trachea midline, no stridor , thyroid nl, carotid no bruit Chest - symmetrical excursion , unlabored           Heart/CV- RRR , no murmur , no gallop  , no rub, nl s1 s2                           - JVD- none , edema- none, stasis changes- none, varices- none  Lung- clear/unlabored, wheeze- none, cough- none , dullness-none, rub- none           Chest wall-  Abd-  Br/ Gen/ Rectal- Not done, not indicated Extrem- cyanosis- none, clubbing, none, atrophy- none, strength- nl Neuro- + obviously looking to wife to answer questions

## 2020-04-03 ENCOUNTER — Ambulatory Visit (INDEPENDENT_AMBULATORY_CARE_PROVIDER_SITE_OTHER): Payer: PPO | Admitting: Internal Medicine

## 2020-04-03 ENCOUNTER — Other Ambulatory Visit: Payer: Self-pay

## 2020-04-03 ENCOUNTER — Encounter: Payer: Self-pay | Admitting: Internal Medicine

## 2020-04-03 DIAGNOSIS — G301 Alzheimer's disease with late onset: Secondary | ICD-10-CM | POA: Diagnosis not present

## 2020-04-03 DIAGNOSIS — F0280 Dementia in other diseases classified elsewhere without behavioral disturbance: Secondary | ICD-10-CM

## 2020-04-03 DIAGNOSIS — G4733 Obstructive sleep apnea (adult) (pediatric): Secondary | ICD-10-CM

## 2020-04-03 NOTE — Patient Instructions (Signed)
We can continue CPAP auto 5-15  Our goal is to wear CPAP all night every night - at least as much as you can.  Pease call if there are more concerns about how the machine is working. The [employer] company can service the machine if necessary.  Please call if we can help

## 2020-04-03 NOTE — Assessment & Plan Note (Signed)
Benefits from CPAP when worn. As dementia progresses, it may be harder for him to remember to keep it on. Wife asked about Inspire. After brief description she didn't feel it would be appropriate for him. Plan- continue auto 5-15

## 2020-04-03 NOTE — Assessment & Plan Note (Signed)
He is forgetting what CPAP is and what it is for at times during the night. We may get to a point where it is no longer practical.

## 2020-06-11 DIAGNOSIS — F0280 Dementia in other diseases classified elsewhere without behavioral disturbance: Secondary | ICD-10-CM | POA: Diagnosis not present

## 2020-06-11 DIAGNOSIS — G301 Alzheimer's disease with late onset: Secondary | ICD-10-CM | POA: Diagnosis not present

## 2020-06-12 ENCOUNTER — Ambulatory Visit: Payer: PPO | Admitting: Cardiology

## 2020-06-12 ENCOUNTER — Other Ambulatory Visit: Payer: Self-pay

## 2020-06-12 ENCOUNTER — Encounter: Payer: Self-pay | Admitting: Cardiology

## 2020-06-12 VITALS — BP 112/70 | HR 72 | Ht 70.0 in | Wt 177.0 lb

## 2020-06-12 DIAGNOSIS — I452 Bifascicular block: Secondary | ICD-10-CM | POA: Diagnosis not present

## 2020-06-12 DIAGNOSIS — R0789 Other chest pain: Secondary | ICD-10-CM

## 2020-06-12 MED ORDER — FAMOTIDINE 20 MG PO TABS: 20.0000 mg | ORAL_TABLET | Freq: Every day | ORAL | 3 refills | Status: DC

## 2020-06-12 NOTE — Patient Instructions (Signed)
Medication Instructions:  Please start Pepcid 20 mg a day. Continue all other medications as listed.  *If you need a refill on your cardiac medications before your next appointment, please call your pharmacy*  Follow-Up: At [facility], you and your health needs are our priority.  As part of our continuing mission to provide you with exceptional heart care, we have created designated Provider Care Teams.  These Care Teams include your primary Cardiologist (physician) and Advanced Practice Providers (APPs -  Physician Assistants and Nurse Practitioners) who all work together to provide you with the care you need, when you need it.  We recommend signing up for the patient portal called "MyChart".  Sign up information is provided on this After Visit Summary.  MyChart is used to connect with patients for Virtual Visits (Telemedicine).  Patients are able to view lab/test results, encounter notes, upcoming appointments, etc.  Non-urgent messages can be sent to your provider as well.   To learn more about what you can do with MyChart, go to [email or web address].    Your next appointment:   12 month(s)  The format for your next appointment:   In Person  Provider:   Candee Furbish, MD   Thank you for choosing [facility]!!

## 2020-06-12 NOTE — Progress Notes (Signed)
Cardiology Office Note:    Date:  06/12/2020   ID:  Dylan [name], DOB 11-12-1935, MRN [id number]  PCP:  [name], MD  [facility] Cardiologist:  Candee Furbish, MD  [facility] Electrophysiologist:  None   Referring MD: [name], MD    History of Present Illness:    Dylan [name] is a 85 y.o. male here for the follow-up of right bundle branch block.  Previously was seen in 2019 after an episode of transient blackened vision.  Has late onset Alzheimer's.  Hard to quantify.  Recently went to [facility] and had testing performed.  Had previous work-up with MRI/MRA of brain.  Prior uncle died at age 78 from MI.  He never smoked.  Nonexertional right/left/central chest discomfort at times-TUMS, Pepto seems to help. GERD like. Right and left and central pain.  Nonexertional.  He is able to walk without difficulty.    Past Medical History:  Diagnosis Date  . High cholesterol   . Kidney stone   . Memory loss   . Sleep apnea    NPSG 08-03-07 AHI 10/hr, RDI 19.7/hr    Past Surgical History:  Procedure Laterality Date  . right clavicle repair  1954   fx  . [place]  . WISDOM TOOTH EXTRACTION  1961    Current Medications: Current Meds  Medication Sig  . famotidine (PEPCID) 20 MG tablet Take 1 tablet (20 mg total) by mouth daily.  . fluorouracil (EFUDEX) 5 % cream 1 APPLICATION APPLY TO THE SCALP NIGHTLY APPLY TO SCALP NIGHTLY FOR14 DAYS  . ketoconazole (NIZORAL) 2 % cream Apply 1 application 2 (two) times daily topically.  . memantine (NAMENDA) 10 MG tablet TAKE 1 TABLET BY MOUTH TWICE A DAY  . potassium citrate (UROCIT-K) 10 MEQ (1080 MG) SR tablet Take 2 tablets by mouth 2 (two) times daily.   . rivastigmine (EXELON) 9.5 mg/24hr Place 9.5 mg onto the skin daily.  . sertraline (ZOLOFT) 50 MG tablet Take 100 mg by mouth daily.   . simvastatin (ZOCOR) 80 MG tablet Take 80 mg by mouth at bedtime.  . tamsulosin (FLOMAX) 0.4 MG CAPS capsule Take 0.4 mg  by mouth daily.  . Turmeric 500 MG CAPS Take 1 capsule by mouth daily.     Allergies:   Patient has no known allergies.   Social History   Socioeconomic History  . Marital status: Married    Spouse name: Dylan [name]   . Number of children: 3  . Years of education: 12+  . Highest education level: Not on file  Occupational History  . Occupation: Real Estate-rental properties  Tobacco Use  . Smoking status: Never Smoker  . Smokeless tobacco: Never Used  Vaping Use  . Vaping Use: Never used  Substance and Sexual Activity  . Alcohol use: Yes    Alcohol/week: 1.0 standard drink    Types: 1 Glasses of wine per week    Comment: Socially; not weekly use  . Drug use: No  . Sexual activity: Not on file  Other Topics Concern  . Not on file  Social History Narrative   Lives at home with wife, Dylan [name].   Caffeine use: 2 cups of tea daily   Right handed   Social Determinants of Health   Financial Resource Strain: Not on file  Food Insecurity: Not on file  Transportation Needs: Not on file  Physical Activity: Not on file  Stress: Not on file  Social Connections: Not on file  Family History: The patient's family history includes Cancer in his mother; Heart disease in his father; Pulmonary embolism in his mother; Stroke in his father.  ROS:   Please see the history of present illness.     All other systems reviewed and are negative.  EKGs/Labs/Other Studies Reviewed:    The following studies were reviewed today:  Event monitor 2019:  Normal sinus rhythm.  Mild sinus bradycardia during sleep (expected)  No pauses  No atrial fibrillation  No adverse arrhythmias  Reassuring monitor.  EKG:  EKG is  ordered today.  The ekg ordered today demonstrates sinus rhythm 72 right bundle branch block personally reviewed and interpreted  Recent Labs: No results found for requested labs within last 8760 hours.  Recent Lipid Panel No results found for: CHOL, TRIG, HDL, CHOLHDL, VLDL,  LDLCALC, LDLDIRECT   Risk Assessment/Calculations:       Physical Exam:    VS:  BP 112/70 (BP Location: Left Arm, Patient Position: Sitting, Cuff Size: Normal)   Pulse 72   Ht 5\' 10"  (1.778 m)   Wt 177 lb (80.3 kg)   SpO2 95%   BMI 25.40 kg/m     Wt Readings from Last 3 Encounters:  06/12/20 177 lb (80.3 kg)  04/03/20 180 lb (81.6 kg)  03/26/20 178 lb (80.7 kg)     GEN:  Well nourished, well developed in no acute distress HEENT: Normal NECK: No JVD; No carotid bruits LYMPHATICS: No lymphadenopathy CARDIAC: RRR, no murmurs, rubs, gallops RESPIRATORY:  Clear to auscultation without rales, wheezing or rhonchi  ABDOMEN: Soft, non-tender, non-distended MUSCULOSKELETAL:  No edema; No deformity  SKIN: Warm and dry NEUROLOGIC:  Alert and oriented x 3 PSYCHIATRIC:  Normal affect   ASSESSMENT:    1. Right BBB/left ant fasc block   2. Atypical chest pain    PLAN:    In order of problems listed above:  Right bundle branch block - Stable no syncope.  Atypical chest discomfort - Likely GERD related.  Agree with treatment strategy.  We will add Pepcid 20 mg daily.  Hopefully this will help.  Was not previously interested in taking omeprazole.  Dementia - His wife does state that he had a episode where he wandered through the woods to get to [place].  The police were able to ping his phone. -He is getting [facility]        Medication Adjustments/Labs and Tests Ordered: Current medicines are reviewed at length with the patient today.  Concerns regarding medicines are outlined above.  Orders Placed This Encounter  Procedures  . EKG 12-Lead   Meds ordered this encounter  Medications  . famotidine (PEPCID) 20 MG tablet    Sig: Take 1 tablet (20 mg total) by mouth daily.    Dispense:  90 tablet    Refill:  3    Patient Instructions  Medication Instructions:  Please start Pepcid 20 mg a day. Continue all other medications as listed.  *If you need a  refill on your cardiac medications before your next appointment, please call your pharmacy*  Follow-Up: At [facility], you and your health needs are our priority.  As part of our continuing mission to provide you with exceptional heart care, we have created designated Provider Care Teams.  These Care Teams include your primary Cardiologist (physician) and Advanced Practice Providers (APPs -  Physician Assistants and Nurse Practitioners) who all work together to provide you with the care you need, when you need it.  We recommend signing up for the patient portal called "MyChart".  Sign up information is provided on this After Visit Summary.  MyChart is used to connect with patients for Virtual Visits (Telemedicine).  Patients are able to view lab/test results, encounter notes, upcoming appointments, etc.  Non-urgent messages can be sent to your provider as well.   To learn more about what you can do with MyChart, go to [email or web address].    Your next appointment:   12 month(s)  The format for your next appointment:   In Person  Provider:   Candee Furbish, MD   Thank you for choosing [facility]!!        Signed, Candee Furbish, MD  06/12/2020 11:56 AM    [place]

## 2020-07-22 DIAGNOSIS — H00021 Hordeolum internum right upper eyelid: Secondary | ICD-10-CM | POA: Diagnosis not present

## 2020-07-22 DIAGNOSIS — D492 Neoplasm of unspecified behavior of bone, soft tissue, and skin: Secondary | ICD-10-CM | POA: Diagnosis not present

## 2020-07-22 DIAGNOSIS — H0102A Squamous blepharitis right eye, upper and lower eyelids: Secondary | ICD-10-CM | POA: Diagnosis not present

## 2020-07-22 DIAGNOSIS — H0102B Squamous blepharitis left eye, upper and lower eyelids: Secondary | ICD-10-CM | POA: Diagnosis not present

## 2020-07-31 DIAGNOSIS — H0102A Squamous blepharitis right eye, upper and lower eyelids: Secondary | ICD-10-CM | POA: Diagnosis not present

## 2020-07-31 DIAGNOSIS — H00021 Hordeolum internum right upper eyelid: Secondary | ICD-10-CM | POA: Diagnosis not present

## 2020-07-31 DIAGNOSIS — D492 Neoplasm of unspecified behavior of bone, soft tissue, and skin: Secondary | ICD-10-CM | POA: Diagnosis not present

## 2020-07-31 DIAGNOSIS — H0102B Squamous blepharitis left eye, upper and lower eyelids: Secondary | ICD-10-CM | POA: Diagnosis not present

## 2020-08-22 DIAGNOSIS — Z125 Encounter for screening for malignant neoplasm of prostate: Secondary | ICD-10-CM | POA: Diagnosis not present

## 2020-08-22 DIAGNOSIS — R7302 Impaired glucose tolerance (oral): Secondary | ICD-10-CM | POA: Diagnosis not present

## 2020-08-22 DIAGNOSIS — E7800 Pure hypercholesterolemia, unspecified: Secondary | ICD-10-CM | POA: Diagnosis not present

## 2020-08-29 DIAGNOSIS — R82998 Other abnormal findings in urine: Secondary | ICD-10-CM | POA: Diagnosis not present

## 2020-08-29 DIAGNOSIS — D696 Thrombocytopenia, unspecified: Secondary | ICD-10-CM | POA: Diagnosis not present

## 2020-08-29 DIAGNOSIS — Z1339 Encounter for screening examination for other mental health and behavioral disorders: Secondary | ICD-10-CM | POA: Diagnosis not present

## 2020-08-29 DIAGNOSIS — N3010 Interstitial cystitis (chronic) without hematuria: Secondary | ICD-10-CM | POA: Diagnosis not present

## 2020-08-29 DIAGNOSIS — Z0000 Encounter for general adult medical examination without abnormal findings: Secondary | ICD-10-CM | POA: Diagnosis not present

## 2020-08-29 DIAGNOSIS — N401 Enlarged prostate with lower urinary tract symptoms: Secondary | ICD-10-CM | POA: Diagnosis not present

## 2020-08-29 DIAGNOSIS — D692 Other nonthrombocytopenic purpura: Secondary | ICD-10-CM | POA: Diagnosis not present

## 2020-08-29 DIAGNOSIS — F419 Anxiety disorder, unspecified: Secondary | ICD-10-CM | POA: Diagnosis not present

## 2020-08-29 DIAGNOSIS — Z1331 Encounter for screening for depression: Secondary | ICD-10-CM | POA: Diagnosis not present

## 2020-08-29 DIAGNOSIS — K219 Gastro-esophageal reflux disease without esophagitis: Secondary | ICD-10-CM | POA: Diagnosis not present

## 2020-08-29 DIAGNOSIS — E7800 Pure hypercholesterolemia, unspecified: Secondary | ICD-10-CM | POA: Diagnosis not present

## 2020-08-29 DIAGNOSIS — R972 Elevated prostate specific antigen [PSA]: Secondary | ICD-10-CM | POA: Diagnosis not present

## 2020-08-29 DIAGNOSIS — F0390 Unspecified dementia without behavioral disturbance: Secondary | ICD-10-CM | POA: Diagnosis not present

## 2020-09-09 DIAGNOSIS — N401 Enlarged prostate with lower urinary tract symptoms: Secondary | ICD-10-CM | POA: Diagnosis not present

## 2020-09-09 DIAGNOSIS — N200 Calculus of kidney: Secondary | ICD-10-CM | POA: Diagnosis not present

## 2020-09-09 DIAGNOSIS — R3915 Urgency of urination: Secondary | ICD-10-CM | POA: Diagnosis not present

## 2020-09-18 ENCOUNTER — Encounter (INDEPENDENT_AMBULATORY_CARE_PROVIDER_SITE_OTHER): Payer: Self-pay | Admitting: Ophthalmology

## 2020-09-18 ENCOUNTER — Other Ambulatory Visit: Payer: Self-pay

## 2020-09-18 ENCOUNTER — Ambulatory Visit (INDEPENDENT_AMBULATORY_CARE_PROVIDER_SITE_OTHER): Payer: PPO | Admitting: Ophthalmology

## 2020-09-18 DIAGNOSIS — H43813 Vitreous degeneration, bilateral: Secondary | ICD-10-CM | POA: Diagnosis not present

## 2020-09-18 NOTE — Assessment & Plan Note (Signed)
Cataract(s) account for the patient's complaint. I discussed the risks and benefits of cataract surgery. Options were explained to the patient. The patient understands that new glasses may not improve their vision and desires to have cataract surgery. I have recommended follow up with their general eye care doctor for evaluation and consideration of cataract extraction with new intraocular lens insertion.   The nature of posterior vitreous detachment was discussed with the patient as well as its physiology, its age prevalence, and its possible implication regarding retinal breaks and detachment.  An informational brochure was given to the patient.  All the patient's questions were answered.  The patient was asked to return if new or different flashes or floaters develops.   Patient was instructed to contact office immediately if any changes were noticed. I explained to the patient that vitreous inside the eye is similar to jello inside a bowl. As the jello melts it can start to pull away from the bowl, similarly the vitreous throughout our lives can begin to pull away from the retina. That process is called a posterior vitreous detachment. In some cases, the vitreous can tug hard enough on the retina to form a retinal tear. I discussed with the patient the signs and symptoms of a retinal detachment.  Do not rub the eye.    Visual axis involve by dense cataract requires surgical intervention via cataract extraction with intraocular lens placement so as to allow for my medical monitoring of the macular condition as well as enhance and maximize visual potential for this function of this eye as this patient enters that year of life with reduced ambulatory capability

## 2020-09-18 NOTE — Progress Notes (Signed)
09/18/2020     CHIEF COMPLAINT Patient presents for Retina Follow Up (1 Year F/U OU//Pt reports black floaters OU off and on x 6 months. [facility]. No other new symptoms OU.)   HISTORY OF PRESENT ILLNESS: Dylan [name] is a 85 y.o. male who presents to the clinic today for:   HPI    Retina Follow Up    Patient presents with  Other.  In both eyes.  This started 1 year ago.  Severity is mild.  Duration of 1 year.  Since onset it is stable. Additional comments: 1 Year F/U OU  Pt reports black floaters OU off and on x 6 months. [facility]. No other new symptoms OU.       Last edited by Rockie Neighbours, [place] on 09/18/2020 10:46 AM. (History)      Referring physician: [name], MD [place],  [place] [id number]  HISTORICAL INFORMATION:   Selected notes from the [facility]: No current outpatient medications on file. (Ophthalmic Drugs)   No current facility-administered medications for this visit. (Ophthalmic Drugs)   Current Outpatient Medications (Other)  Medication Sig  . famotidine (PEPCID) 20 MG tablet Take 1 tablet (20 mg total) by mouth daily.  . fluorouracil (EFUDEX) 5 % cream 1 APPLICATION APPLY TO THE SCALP NIGHTLY APPLY TO SCALP NIGHTLY FOR14 DAYS  . ketoconazole (NIZORAL) 2 % cream Apply 1 application 2 (two) times daily topically.  . memantine (NAMENDA) 10 MG tablet TAKE 1 TABLET BY MOUTH TWICE A DAY  . potassium citrate (UROCIT-K) 10 MEQ (1080 MG) SR tablet Take 2 tablets by mouth 2 (two) times daily.   . rivastigmine (EXELON) 9.5 mg/24hr Place 9.5 mg onto the skin daily.  . sertraline (ZOLOFT) 50 MG tablet Take 100 mg by mouth daily.   . simvastatin (ZOCOR) 80 MG tablet Take 80 mg by mouth at bedtime.  . tamsulosin (FLOMAX) 0.4 MG CAPS capsule Take 0.4 mg by mouth daily.  . Turmeric 500 MG CAPS Take 1 capsule by mouth daily.   No current facility-administered medications for this visit.  (Other)      REVIEW OF SYSTEMS:    ALLERGIES No Known Allergies  PAST MEDICAL HISTORY Past Medical History:  Diagnosis Date  . High cholesterol   . Kidney stone   . Memory loss   . Sleep apnea    NPSG 08-03-07 AHI 10/hr, RDI 19.7/hr   Past Surgical History:  Procedure Laterality Date  . right clavicle repair  1954   fx  . [place]  . WISDOM TOOTH EXTRACTION  1961    FAMILY HISTORY Family History  Problem Relation Age of Onset  . Heart disease Father   . Stroke Father   . Cancer Mother   . Pulmonary embolism Mother     SOCIAL HISTORY Social History   Tobacco Use  . Smoking status: Never Smoker  . Smokeless tobacco: Never Used  Vaping Use  . Vaping Use: Never used  Substance Use Topics  . Alcohol use: Yes    Alcohol/week: 1.0 standard drink    Types: 1 Glasses of wine per week    Comment: Socially; not weekly use  . Drug use: No         OPHTHALMIC EXAM: Base Eye Exam    Visual Acuity (ETDRS)      Right Left   Dist [place] 20/20 -1  20/25 -2       Tonometry (Tonopen, 10:50 AM)      Right Left   Pressure 13 14       Pupils      Pupils Dark Light Shape React APD   Right PERRL 4 4 Round Minimal None   Left PERRL 4 4 Round Minimal None       Visual Fields (Counting fingers)      Left Right    Full Full       Extraocular Movement      Right Left    Full Full       Neuro/Psych    Oriented x3: Yes   Mood/Affect: Normal       Dilation    Both eyes: 1.0% Mydriacyl, 2.5% Phenylephrine @ 10:50 AM        Slit Lamp and Fundus Exam    External Exam      Right Left   External Normal Normal       Slit Lamp Exam      Right Left   Lids/Lashes Normal Normal   Conjunctiva/Sclera White and quiet White and quiet   Cornea Clear Clear   Anterior Chamber Deep and quiet Deep and quiet   Iris Round and reactive Round and reactive   Lens Posterior chamber intraocular lens Posterior chamber intraocular lens   Anterior Vitreous Normal  Normal       Fundus Exam      Right Left   Posterior Vitreous Posterior vitreous detachment Posterior vitreous detachment   Disc Normal Normal   C/D Ratio 0.1 0.3   Macula Normal Normal   Vessels Normal Normal   Periphery Normal, no breaks Normal, no breaks          IMAGING AND PROCEDURES  Imaging and Procedures for 09/18/20  OCT, Retina - OU - Both Eyes       Right Eye Quality was good. Scan locations included subfoveal. Central Foveal Thickness: 277. Findings include normal foveal contour.   Left Eye Quality was good. Scan locations included subfoveal. Central Foveal Thickness: 273. Progression has been stable. Findings include normal foveal contour.   Notes POSTERIOR VITREOUS DETACHMENT                ASSESSMENT/PLAN:  Posterior vitreous detachment of both eyes Cataract(s) account for the patient's complaint. I discussed the risks and benefits of cataract surgery. Options were explained to the patient. The patient understands that new glasses may not improve their vision and desires to have cataract surgery. I have recommended follow up with their general eye care doctor for evaluation and consideration of cataract extraction with new intraocular lens insertion.   The nature of posterior vitreous detachment was discussed with the patient as well as its physiology, its age prevalence, and its possible implication regarding retinal breaks and detachment.  An informational brochure was given to the patient.  All the patient's questions were answered.  The patient was asked to return if new or different flashes or floaters develops.   Patient was instructed to contact office immediately if any changes were noticed. I explained to the patient that vitreous inside the eye is similar to jello inside a bowl. As the jello melts it can start to pull away from the bowl, similarly the vitreous throughout our lives can begin to pull away from the retina. That process is called a  posterior vitreous detachment. In some cases, the vitreous can tug hard enough on the retina to form  a retinal tear. I discussed with the patient the signs and symptoms of a retinal detachment.  Do not rub the eye.    Visual axis involve by dense cataract requires surgical intervention via cataract extraction with intraocular lens placement so as to allow for my medical monitoring of the macular condition as well as enhance and maximize visual potential for this function of this eye as this patient enters that year of life with reduced ambulatory capability      ICD-10-CM   1. Posterior vitreous detachment of both eyes  H43.813 OCT, Retina - OU - Both Eyes    1.  History of retinal hole, no new retinal breaks or tears will observe 2.  3.  Ophthalmic Meds Ordered this visit:  No orders of the defined types were placed in this encounter.      Return in about 1 year (around 09/18/2021) for DILATE OU, COLOR FP.  There are no Patient Instructions on file for this visit.   Explained the diagnoses, plan, and follow up with the patient and they expressed understanding.  Patient expressed understanding of the importance of proper follow up care.   Clent Demark Jesusa Stenerson M.D. Diseases & Surgery of the Retina and Vitreous Retina [facility] Diabetic [place] 09/18/20     Abbreviations: M myopia (nearsighted); A astigmatism; H hyperopia (farsighted); P presbyopia; Mrx spectacle prescription;  CTL contact lenses; OD right eye; OS left eye; OU both eyes  XT exotropia; ET esotropia; PEK punctate epithelial keratitis; PEE punctate epithelial erosions; DES dry eye syndrome; MGD meibomian gland dysfunction; ATs artificial tears; PFAT's preservative free artificial tears; [place] nuclear sclerotic cataract; PSC posterior subcapsular cataract; ERM epi-retinal membrane; PVD posterior vitreous detachment; RD retinal detachment; DM diabetes mellitus; DR diabetic retinopathy; NPDR non-proliferative diabetic retinopathy;  PDR proliferative diabetic retinopathy; CSME clinically significant macular edema; DME diabetic macular edema; dbh dot blot hemorrhages; CWS cotton wool spot; POAG primary open angle glaucoma; C/D cup-to-disc ratio; HVF humphrey visual field; GVF goldmann visual field; OCT optical coherence tomography; IOP intraocular pressure; BRVO Branch retinal vein occlusion; CRVO central retinal vein occlusion; CRAO central retinal artery occlusion; BRAO branch retinal artery occlusion; RT retinal tear; SB scleral buckle; PPV pars plana vitrectomy; VH Vitreous hemorrhage; PRP panretinal laser photocoagulation; IVK intravitreal kenalog; VMT vitreomacular traction; MH Macular hole;  NVD neovascularization of the disc; NVE neovascularization elsewhere; AREDS age related eye disease study; ARMD age related macular degeneration; POAG primary open angle glaucoma; EBMD epithelial/anterior basement membrane dystrophy; ACIOL anterior chamber intraocular lens; IOL intraocular lens; PCIOL posterior chamber intraocular lens; Phaco/IOL phacoemulsification with intraocular lens placement; [place] photorefractive keratectomy; LASIK laser assisted in situ keratomileusis; HTN hypertension; DM diabetes mellitus; COPD chronic obstructive pulmonary disease

## 2020-10-15 DIAGNOSIS — Z111 Encounter for screening for respiratory tuberculosis: Secondary | ICD-10-CM | POA: Diagnosis not present

## 2021-02-19 DIAGNOSIS — H53453 Other localized visual field defect, bilateral: Secondary | ICD-10-CM | POA: Diagnosis not present

## 2021-02-19 DIAGNOSIS — H0288B Meibomian gland dysfunction left eye, upper and lower eyelids: Secondary | ICD-10-CM | POA: Diagnosis not present

## 2021-02-19 DIAGNOSIS — H02834 Dermatochalasis of left upper eyelid: Secondary | ICD-10-CM | POA: Diagnosis not present

## 2021-02-19 DIAGNOSIS — H40013 Open angle with borderline findings, low risk, bilateral: Secondary | ICD-10-CM | POA: Diagnosis not present

## 2021-02-19 DIAGNOSIS — H0288A Meibomian gland dysfunction right eye, upper and lower eyelids: Secondary | ICD-10-CM | POA: Diagnosis not present

## 2021-02-19 DIAGNOSIS — H0102B Squamous blepharitis left eye, upper and lower eyelids: Secondary | ICD-10-CM | POA: Diagnosis not present

## 2021-02-19 DIAGNOSIS — H16143 Punctate keratitis, bilateral: Secondary | ICD-10-CM | POA: Diagnosis not present

## 2021-02-19 DIAGNOSIS — H02831 Dermatochalasis of right upper eyelid: Secondary | ICD-10-CM | POA: Diagnosis not present

## 2021-02-19 DIAGNOSIS — H0102A Squamous blepharitis right eye, upper and lower eyelids: Secondary | ICD-10-CM | POA: Diagnosis not present

## 2021-02-19 DIAGNOSIS — D492 Neoplasm of unspecified behavior of bone, soft tissue, and skin: Secondary | ICD-10-CM | POA: Diagnosis not present

## 2021-02-19 DIAGNOSIS — H04123 Dry eye syndrome of bilateral lacrimal glands: Secondary | ICD-10-CM | POA: Diagnosis not present

## 2021-03-03 DIAGNOSIS — D696 Thrombocytopenia, unspecified: Secondary | ICD-10-CM | POA: Diagnosis not present

## 2021-03-03 DIAGNOSIS — G4762 Sleep related leg cramps: Secondary | ICD-10-CM | POA: Diagnosis not present

## 2021-03-03 DIAGNOSIS — F419 Anxiety disorder, unspecified: Secondary | ICD-10-CM | POA: Diagnosis not present

## 2021-03-03 DIAGNOSIS — Z23 Encounter for immunization: Secondary | ICD-10-CM | POA: Diagnosis not present

## 2021-03-03 DIAGNOSIS — F0390 Unspecified dementia without behavioral disturbance: Secondary | ICD-10-CM | POA: Diagnosis not present

## 2021-03-03 DIAGNOSIS — G4733 Obstructive sleep apnea (adult) (pediatric): Secondary | ICD-10-CM | POA: Diagnosis not present

## 2021-03-03 DIAGNOSIS — R7302 Impaired glucose tolerance (oral): Secondary | ICD-10-CM | POA: Diagnosis not present

## 2021-03-03 DIAGNOSIS — D692 Other nonthrombocytopenic purpura: Secondary | ICD-10-CM | POA: Diagnosis not present

## 2021-03-03 DIAGNOSIS — N401 Enlarged prostate with lower urinary tract symptoms: Secondary | ICD-10-CM | POA: Diagnosis not present

## 2021-03-03 DIAGNOSIS — N3010 Interstitial cystitis (chronic) without hematuria: Secondary | ICD-10-CM | POA: Diagnosis not present

## 2021-03-03 DIAGNOSIS — E7800 Pure hypercholesterolemia, unspecified: Secondary | ICD-10-CM | POA: Diagnosis not present

## 2021-04-02 ENCOUNTER — Ambulatory Visit: Payer: PPO | Admitting: Internal Medicine

## 2021-05-19 NOTE — Progress Notes (Signed)
Subjective:    Patient ID: Dylan [name], male    DOB: 05-04-1936, 85 y.o.   MRN: [id number]  HPI M Never smoker followed for OSA, Restless Legs, hx allergy.  -----------------------------------------------------------------------------------------    04/03/20- 85 year old male never smoker followed for OSA, Restless Legs, history allergic rhinitis, complicated by GERD, Alzheimer's/ cerebral amyloid angiopathy,  CPAP auto 5-15/[employer]   Replaced 2019 Followed by Neurology for Alzheimer's Download- Body weight today- Covid vax- 2 Moderna Flu vax- had -----pt wife states machine makes a noise pt takes it off at night due to memory problems. Wife relates that daughter heard machine make "screeching noise", but only once. I reviewed AirView download with wife so she would be aware of frequent missed nights. Wife does virtually all the talking.  05/20/21- 85 year old male never smoker followed for OSA/ quit CPAP, Restless Legs, history allergic rhinitis, complicated by GERD, Alzheimer's/ cerebral amyloid angiopathy,  CPAP auto 5-15/[employer]   Replaced 2019 Followed by Neurology for Alzheimer's Download- No longer using Body weight today-176lbs Covid vax- 3 Moderna Flu vax- had ------Not wearing CPAP- removes it during the night Wife is here and primary source of information.  Dylan [name] did not seem to understand the circumstances of this visit and only asked occasionally about "the process of getting my shot". They gave up trying to keep CPAP on him.  Also he wanders some at night and she has taken of appropriate safety precautions in the home.  I discussed ways to encourage him to sleep off the flat of his back as a way of mildly reducing sleep apnea and snoring.  ROS-see HPI    + = positive Constitutional:   No-   weight loss, night sweats, fevers, chills, fatigue, lassitude. HEENT:   No-  headaches, difficulty swallowing, tooth/dental problems, sore throat,       No-  sneezing, itching,  ear ache, nasal congestion, post nasal drip,  CV:  No-   chest pain, orthopnea, PND, swelling in lower extremities, anasarca, dizziness, palpitations Resp:-  shortness of breath with exertion or at rest.              No-   productive cough,   non-productive cough,  No- coughing up of blood.              No-   change in color of mucus.   wheezing.   Skin: No-   rash or lesions. GI:  +  heartburn, no-indigestion, abdominal pain, nausea, vomiting,  GU:  MS:  No-   joint pain or swelling.  Neuro-     Per HPI. + Memory loss is progressive. Psych:  No- change in mood or affect. No depression or anxiety.  No memory loss.  OBJ- Physical Exam General- Alert, +Not well oriented to circumstance, Affect-appropriate, Distress- none acute, +soft-spoken, Skin- rash-none, lesions- none, excoriation- none Lymphadenopathy- none Head- atraumatic            Eyes- Gross vision intact, PERRLA, conjunctivae and secretions clear            Ears- grossly normal hearing            Nose- Clear, no-Septal dev, mucus, polyps, erosion, perforation             Throat- Mallampati II-III , mucosa-normal, drainage- none, tonsils- atrophic Neck- flexible , trachea midline, no stridor , thyroid nl, carotid no bruit Chest - symmetrical excursion , unlabored           Heart/CV- RRR , no  murmur , no gallop  , no rub, nl s1 s2                           - JVD- none , edema- none, stasis changes- none, varices- none           Lung- clear/unlabored, wheeze- none, cough- none , dullness-none, rub- none           Chest wall-  Abd-  Br/ Gen/ Rectal- Not done, not indicated Extrem- cyanosis- none, clubbing, none, atrophy- none, strength- nl Neuro- + obviously looking to wife to answer questions

## 2021-05-20 ENCOUNTER — Ambulatory Visit: Payer: PPO | Admitting: Internal Medicine

## 2021-05-20 ENCOUNTER — Other Ambulatory Visit: Payer: Self-pay

## 2021-05-20 ENCOUNTER — Encounter: Payer: Self-pay | Admitting: Internal Medicine

## 2021-05-20 VITALS — BP 138/78 | HR 79 | Temp 97.7°F | Ht 70.0 in | Wt 176.8 lb

## 2021-05-20 DIAGNOSIS — G4733 Obstructive sleep apnea (adult) (pediatric): Secondary | ICD-10-CM

## 2021-05-20 DIAGNOSIS — G301 Alzheimer's disease with late onset: Secondary | ICD-10-CM

## 2021-05-20 DIAGNOSIS — F0280 Dementia in other diseases classified elsewhere without behavioral disturbance: Secondary | ICD-10-CM

## 2021-05-20 NOTE — Patient Instructions (Signed)
Order- DME [employer]- d/c  CPAP  We suggested that attaching a tennis ball in a sock to the middle of back of a night shirt might encourage hm to sleep off the flat of his back. This may help snoring and sleep apnea a little.   Please cal if we can help.

## 2021-05-21 NOTE — Assessment & Plan Note (Signed)
Wife reports that he wanders some at night and cannot remember to keep CPAP on.  Followed by neurology.

## 2021-05-21 NOTE — Assessment & Plan Note (Signed)
He can no longer manage CPAP.  He won't keep it on. Plan-I suggested to wife that she try putting a tennis ball in a sock and safety pinning that to the back of his night shirt to see if that would encourage him to sleep off the flat of his back.

## 2021-05-29 DIAGNOSIS — Z1152 Encounter for screening for COVID-19: Secondary | ICD-10-CM | POA: Diagnosis not present

## 2021-05-29 DIAGNOSIS — R5383 Other fatigue: Secondary | ICD-10-CM | POA: Diagnosis not present

## 2021-05-29 DIAGNOSIS — R059 Cough, unspecified: Secondary | ICD-10-CM | POA: Diagnosis not present

## 2021-05-29 DIAGNOSIS — R0981 Nasal congestion: Secondary | ICD-10-CM | POA: Diagnosis not present

## 2021-06-26 ENCOUNTER — Ambulatory Visit: Payer: PPO | Admitting: Cardiology

## 2021-06-26 ENCOUNTER — Other Ambulatory Visit: Payer: Self-pay

## 2021-06-26 ENCOUNTER — Encounter: Payer: Self-pay | Admitting: Cardiology

## 2021-06-26 DIAGNOSIS — I680 Cerebral amyloid angiopathy: Secondary | ICD-10-CM

## 2021-06-26 DIAGNOSIS — I950 Idiopathic hypotension: Secondary | ICD-10-CM

## 2021-06-26 DIAGNOSIS — I4510 Unspecified right bundle-branch block: Secondary | ICD-10-CM | POA: Insufficient documentation

## 2021-06-26 DIAGNOSIS — I959 Hypotension, unspecified: Secondary | ICD-10-CM | POA: Insufficient documentation

## 2021-06-26 DIAGNOSIS — E854 Organ-limited amyloidosis: Secondary | ICD-10-CM | POA: Diagnosis not present

## 2021-06-26 DIAGNOSIS — E7800 Pure hypercholesterolemia, unspecified: Secondary | ICD-10-CM

## 2021-06-26 MED ORDER — ROSUVASTATIN CALCIUM 20 MG PO TABS: 20.0000 mg | ORAL_TABLET | Freq: Every day | ORAL | 3 refills | Status: DC

## 2021-06-26 MED ORDER — MIDODRINE HCL 2.5 MG PO TABS: 2.5000 mg | ORAL_TABLET | Freq: Three times a day (TID) | ORAL | 3 refills | Status: DC

## 2021-06-26 NOTE — Assessment & Plan Note (Signed)
We will change his simvastatin 80 mg over to Crestor 20 mg.  This will help avoid drug drug interactions.

## 2021-06-26 NOTE — Patient Instructions (Signed)
Medication Instructions:  Please discontinue your Simvastatin. Start Crestor 20 mg once daily. Start Midodrine 2.5 mg one tablet three times a day with meals. Continue all other medications as listed.  *If you need a refill on your cardiac medications before your next appointment, please call your pharmacy*  Follow-Up: At [facility], you and your health needs are our priority.  As part of our continuing mission to provide you with exceptional heart care, we have created designated Provider Care Teams.  These Care Teams include your primary Cardiologist (physician) and Advanced Practice Providers (APPs -  Physician Assistants and Nurse Practitioners) who all work together to provide you with the care you need, when you need it.  We recommend signing up for the patient portal called "MyChart".  Sign up information is provided on this After Visit Summary.  MyChart is used to connect with patients for Virtual Visits (Telemedicine).  Patients are able to view lab/test results, encounter notes, upcoming appointments, etc.  Non-urgent messages can be sent to your provider as well.   To learn more about what you can do with MyChart, go to [email or web address].    Your next appointment:   3 month(s)  The format for your next appointment:   In Person  Provider:   Robbie Lis, PA-C, Melina Copa, PA-C, Ermalinda Barrios, PA-C, Christen Bame, NP, or Richardson Dopp, PA-C     Then, Candee Furbish, MD will plan to see you again in 1 year(s).    Thank you for choosing [facility]!!   Midodrine tablets What is this medication? MIDODRINE (MI doe dreen) is used to treat low blood pressure in patients who have symptoms like dizziness when going from a sitting to a standing position. This medicine may be used for other purposes; ask your health care provider or pharmacist if you have questions. COMMON BRAND NAME(S): Orvaten, ProAmatine What should I tell my care team before I take this  medication? They need to know if you have any of the following conditions: difficulty passing urine heart disease high blood pressure kidney disease over active thyroid pheochromocytoma an unusual or allergic reaction to midodrine, other medicines, foods, dyes, or preservatives pregnant or trying to get pregnant breast-feeding How should I use this medication? Take this medicine by mouth with a glass of water. Follow the directions on the prescription label. The last dose of this medicine should not be taken after the evening meal or less than 4 hours before bedtime. When you lie down for any length of time after taking this medicine, high blood pressure can occur. Do not take this medicine if you will be lying down for any length of time. Do not take your medicine more often than directed. Do not stop taking except on your doctor's advice. Talk to your pediatrician regarding the use of this medicine in children. Special care may be needed. Overdosage: If you think you have taken too much of this medicine contact a poison control center or emergency room at once. NOTE: This medicine is only for you. Do not share this medicine with others. What if I miss a dose? If you miss a dose, take it as soon as you can. If it is almost time for your next dose, take only that dose. Do not take double or extra doses. What may interact with this medication? Do not take this medicine with any of the following medications: MAOIs like Carbex, Eldepryl, Marplan, Nardil, and Parnate medicines called ergot alkaloids medicines for colds and breathing  difficulties or weight loss procarbazine This medicine may also interact with the following medications: cimetidine digoxin flecainide fludrocortisone metformin procainamide quinidine ranitidine triamterene medicines called alpha-blockers like doxazosin, prazosin, and terazosin This list may not describe all possible interactions. Give your health care  provider a list of all the medicines, herbs, non-prescription drugs, or dietary supplements you use. Also tell them if you smoke, drink alcohol, or use illegal drugs. Some items may interact with your medicine. What should I watch for while using this medication? Visit your doctor or health care professional for regular checks on your progress. You may get drowsy or dizzy. Do not drive, use machinery, or do anything that needs mental alertness until you know how this medicine affects you. Do not stand or sit up quickly, especially if you are an older patient. This reduces the risk of dizzy or fainting spells. Your mouth may get dry. Chewing sugarless gum or sucking hard candy, and drinking plenty of water may help. Contact your doctor if the problem does not go away or is severe. Do not treat yourself for coughs, colds, or pain while you are taking this medicine without asking your doctor or health care professional for advice. Some ingredients may increase your blood pressure. What side effects may I notice from receiving this medication? Side effects that you should report to your doctor or health care professional as soon as possible: awareness of heart beating blurred vision headache irregular heartbeat, palpitations, or chest pain pounding in the ears skin rash, hives Side effects that usually do not require medical attention (report to your doctor or health care professional if they continue or are bothersome): change in heart rate chills goose bumps increased need to urinate itching stomach pain tingling in the skin or scalp This list may not describe all possible side effects. Call your doctor for medical advice about side effects. You may report side effects to FDA at 1-800-FDA-1088. Where should I keep my medication? Keep out of the reach of children. Store at room temperature between 15 and 30 degrees C (59 and 86 degrees F). Throw away any unused medicine after the expiration  date. NOTE: This sheet is a summary. It may not cover all possible information. If you have questions about this medicine, talk to your doctor, pharmacist, or health care provider.  2022 Elsevier/Gold Standard (2008-01-11 00:00:00)  Rosuvastatin Tablets What is this medication? ROSUVASTATIN (roe SOO va sta tin) treats high cholesterol and reduces the risk of heart attack and stroke. It works by decreasing bad cholesterol and fats (such as LDL, triglycerides), and increasing good cholesterol (HDL) in your blood. It belongs to a group of medications called statins. Changes to diet and exercise are often combined with this medication. This medicine may be used for other purposes; ask your health care provider or pharmacist if you have questions. COMMON BRAND NAME(S): Crestor What should I tell my care team before I take this medication? They need to know if you have any of these conditions: Diabetes (high blood sugar) If you often drink alcohol Kidney disease Liver disease Muscle cramps, pain Stroke Thyroid disease An unusual or allergic reaction to rosuvastatin, other medications, foods, dyes, or preservatives Pregnant or trying to get pregnant Breast-feeding How should I use this medication? Take this medication by mouth with a glass of water. Follow the directions on the prescription label. You can take it with or without food. If it upsets your stomach, take it with food. Do not cut, crush or chew this  medication. Swallow the tablets whole. Take your medication at regular intervals. Do not take it more often than directed. Take antacids that have a combination of aluminum and magnesium hydroxide in them at a different time of day than this medication. Take these products 2 hours AFTER this medication. Talk to your care team about the use of this medication in children. While this medication may be prescribed for children as young as 7 for selected conditions, precautions do  apply. Overdosage: If you think you have taken too much of this medicine contact a poison control center or emergency room at once. NOTE: This medicine is only for you. Do not share this medicine with others. What if I miss a dose? If you miss a dose, take it as soon as you can. If your next dose is to be taken in less than 12 hours, then do not take the missed dose. Take the next dose at your regular time. Do not take double or extra doses. What may interact with this medication? Do not take this medication with any of the following: Supplements like red yeast rice This medication may also interact with the following: Alcohol Antacids containing aluminum hydroxide and magnesium hydroxide Cyclosporine Other medications for high cholesterol Some medications for HIV infection Warfarin This list may not describe all possible interactions. Give your health care provider a list of all the medicines, herbs, non-prescription drugs, or dietary supplements you use. Also tell them if you smoke, drink alcohol, or use illegal drugs. Some items may interact with your medicine. What should I watch for while using this medication? Visit your health care provider for regular checks on your progress. Tell your health care provider if your symptoms do not start to get better or if they get worse. Your health care provider may tell you to stop taking this medication if you develop muscle problems. If your muscle problems do not go away after stopping this medication, contact your health care provider. Do not become pregnant while taking this medication. Women should inform their health care provider if they wish to become pregnant or think they might be pregnant. There is potential for serious harm to an unborn child. Talk to your health care provider for more information. Do not breast-feed an infant while taking this medication. This medication may increase blood sugar. Ask your health care provider if changes in  diet or medications are needed if you have diabetes. If you are going to need surgery or other procedure, tell your health care provider that you are using this medication. Taking this medication is only part of a total heart healthy program. Your health care provider may give you a special diet to follow. Avoid alcohol. Avoid smoking. Ask your health care provider how much you should exercise. What side effects may I notice from receiving this medication? Side effects that you should report to your care team as soon as possible: Allergic reactions--skin rash, itching, hives, swelling of the face, lips, tongue, or throat High blood sugar (hyperglycemia)--increased thirst or amount of urine, unusual weakness, fatigue, blurry vision Liver injury--right upper belly pain, loss of appetite, nausea, light-colored stool, dark yellow or brown urine, yellowing skin or eyes, unusual weakness, fatigue Muscle injury--unusual weakness, fatigue, muscle pain, dark yellow or brown urine, decrease in amount of urine Redness, blistering, peeling or loosening of the skin, including inside the mouth Side effects that usually do not require medical attention (report to your care team if they continue or are bothersome): Fatigue Headache  Nausea Stomach pain This list may not describe all possible side effects. Call your doctor for medical advice about side effects. You may report side effects to FDA at 1-800-FDA-1088. Where should I keep my medication? Keep out of the reach of children and pets. Store between 20 and 25 degrees C (68 and 77 degrees F). Get rid of any unused medication after the expiration date. To get rid of medications that are no longer needed or have expired: Take the medication to a medication take-back program. Check with your pharmacy or law enforcement to find a location. If you cannot return the medication, check the label or package insert to see if the medication should be thrown out in the  garbage or flushed down the toilet. If you are not sure, ask your care team. If it is safe to put it in the trash, take the medication out of the container. Mix the medication with cat litter, dirt, coffee grounds, or other unwanted substance. Seal the mixture in a bag or container. Put it in the trash. NOTE: This sheet is a summary. It may not cover all possible information. If you have questions about this medicine, talk to your doctor, pharmacist, or health care provider.  2022 Elsevier/Gold Standard (2020-06-14 00:00:00)

## 2021-06-26 NOTE — Assessment & Plan Note (Signed)
Currently stable without any syncopal episodes.

## 2021-06-26 NOTE — Assessment & Plan Note (Signed)
Blood pressures have been quite low.  Sometimes he feels dizzy, could be orthostatic.  His wife illustrates the history.  We will give him midodrine low-dose 2.5 mg 3 times a day with meals to see if this helps.  Follow-up in 3 months with APP.

## 2021-06-26 NOTE — Assessment & Plan Note (Signed)
Getting treatment at [facility].  Medications reviewed.

## 2021-06-26 NOTE — Progress Notes (Signed)
Cardiology Office Note:    Date:  06/26/2021   ID:  Dylan [name], DOB 1936/04/20, MRN [id number]  PCP:  [name], MD  [facility] Cardiologist:  Candee Furbish, MD  [facility] Electrophysiologist:  None   Referring MD: [name], MD    History of Present Illness:    Dylan [name] is a 85 y.o. male here for the follow-up of RBBB and hyperlipidemia.  Previously was seen in 2019 after an episode of transient blackened vision.  Has late onset Alzheimer's.  Hard to quantify.  Went to [facility] and had testing performed.  Had previous work-up with MRI/MRA of brain.  Prior uncle died at age 1 from MI.  He never smoked.  At his last appointment he reported nonexertional right/left/central chest discomfort at times-TUMS, Pepto seems to help. GERD like. Right and left and central pain.  Nonexertional.  He was able to walk without difficulty.  Today: He is accompanied by his wife, who reports that he becomes dizzy on occasion, with no clear triggers. She believes he may not be staying adequately hydrated. Overall, he appears well.  Usually he does not monitor his BP at home. In clinic today his blood pressure is 90/50.  He is scheduled to follow up at [facility] next month.  He denies any palpitations, chest pain, or shortness of breath. No headaches, syncope, orthopnea, PND, lower extremity edema or exertional symptoms.   Past Medical History:  Diagnosis Date   High cholesterol    Kidney stone    Memory loss    Sleep apnea    NPSG 08-03-07 AHI 10/hr, RDI 19.7/hr    Past Surgical History:  Procedure Laterality Date   right clavicle repair  1954   fx   TONSILLECTOMY  1942   WISDOM TOOTH EXTRACTION  1961    Current Medications: Current Meds  Medication Sig   famotidine (PEPCID) 20 MG tablet Take 1 tablet (20 mg total) by mouth daily.   memantine (NAMENDA) 10 MG tablet TAKE 1 TABLET BY MOUTH TWICE A DAY   midodrine (PROAMATINE) 2.5 MG tablet Take 1 tablet (2.5  mg total) by mouth 3 (three) times daily with meals.   potassium citrate (UROCIT-K) 10 MEQ (1080 MG) SR tablet Take 2 tablets by mouth 2 (two) times daily.    rivastigmine (EXELON) 9.5 mg/24hr Place 9.5 mg onto the skin daily.   rosuvastatin (CRESTOR) 20 MG tablet Take 1 tablet (20 mg total) by mouth daily.   sertraline (ZOLOFT) 50 MG tablet Take 100 mg by mouth daily.    tamsulosin (FLOMAX) 0.4 MG CAPS capsule Take 0.4 mg by mouth daily.   Turmeric 500 MG CAPS Take 1 capsule by mouth daily.   [DISCONTINUED] simvastatin (ZOCOR) 80 MG tablet Take 80 mg by mouth at bedtime.     Allergies:   Patient has no known allergies.   Social History   Socioeconomic History   Marital status: Married    Spouse name: Dylan [name]    Number of children: 3   Years of education: 12+   Highest education level: Not on file  Occupational History   Occupation: Real Estate-rental properties  Tobacco Use   Smoking status: Never   Smokeless tobacco: Never  Vaping Use   Vaping Use: Never used  Substance and Sexual Activity   Alcohol use: Yes    Alcohol/week: 1.0 standard drink    Types: 1 Glasses of wine per week    Comment: Socially; not weekly use  Drug use: No   Sexual activity: Not on file  Other Topics Concern   Not on file  Social History Narrative   Lives at home with wife, Dylan [name].   Caffeine use: 2 cups of tea daily   Right handed   Social Determinants of Health   Financial Resource Strain: Not on file  Food Insecurity: Not on file  Transportation Needs: Not on file  Physical Activity: Not on file  Stress: Not on file  Social Connections: Not on file     Family History: The patient's family history includes Cancer in his mother; Heart disease in his father; Pulmonary embolism in his mother; Stroke in his father.  ROS:   Please see the history of present illness.    (+) Dizziness All other systems reviewed and are negative.  EKGs/Labs/Other Studies Reviewed:    The following studies  were reviewed today:  Echo 10/15/2017: Study Conclusions   - Left ventricle: The cavity size was normal. Wall thickness was    normal. Systolic function was mildly reduced. The estimated    ejection fraction was in the range of 45% to 50%. Mild diffuse    hypokinesis with no identifiable regional variations. Doppler    parameters are consistent with abnormal left ventricular    relaxation (grade 1 diastolic dysfunction).  - Mitral valve: Systolic bowing without prolapse.   Event monitor 2019:  Normal sinus rhythm.  Mild sinus bradycardia during sleep (expected)  No pauses  No atrial fibrillation  No adverse arrhythmias   Reassuring monitor.  EKG:  EKG is personally reviewed and interpreted. 06/26/2021: Sinus rhythm. Rate 74 bpm. RBBB. 06/12/2020: sinus rhythm 72 right bundle branch block  Recent Labs: No results found for requested labs within last 8760 hours.   Recent Lipid Panel No results found for: CHOL, TRIG, HDL, CHOLHDL, VLDL, LDLCALC, LDLDIRECT   Risk Assessment/Calculations:       Physical Exam:    VS:  BP (!) 90/50 (BP Location: Left Arm, Patient Position: Sitting, Cuff Size: Normal)    Pulse 74    Ht 5\' 10"  (1.778 m)    Wt 174 lb (78.9 kg)    SpO2 95%    BMI 24.97 kg/m     Wt Readings from Last 3 Encounters:  06/26/21 174 lb (78.9 kg)  05/20/21 176 lb 12.8 oz (80.2 kg)  06/12/20 177 lb (80.3 kg)     GEN:  Well nourished, well developed in no acute distress HEENT: Normal NECK: No JVD; No carotid bruits LYMPHATICS: No lymphadenopathy CARDIAC: RRR, no murmurs, rubs, gallops RESPIRATORY:  Clear to auscultation without rales, wheezing or rhonchi  ABDOMEN: Soft, non-tender, non-distended MUSCULOSKELETAL:  No edema; No deformity  SKIN: Warm and dry NEUROLOGIC:  Alert  PSYCHIATRIC:  Normal affect   ASSESSMENT:    1. Right bundle branch block   2. Cerebral amyloid angiopathy ([place])   3. Idiopathic hypotension   4. HYPERCHOLESTEROLEMIA     PLAN:     In order of problems listed above:  Right bundle branch block Currently stable without any syncopal episodes.  Cerebral amyloid angiopathy Getting treatment at [facility].  Medications reviewed.  Hypotension Blood pressures have been quite low.  Sometimes he feels dizzy, could be orthostatic.  His wife illustrates the history.  We will give him midodrine low-dose 2.5 mg 3 times a day with meals to see if this helps.  Follow-up in 3 months with APP.  HYPERCHOLESTEROLEMIA We will change his simvastatin 80 mg over to Crestor  20 mg.  This will help avoid drug drug interactions.  Follow-up: 3 months with APP.      Medication Adjustments/Labs and Tests Ordered: Current medicines are reviewed at length with the patient today.  Concerns regarding medicines are outlined above.   Orders Placed This Encounter  Procedures   EKG 12-Lead   Meds ordered this encounter  Medications   rosuvastatin (CRESTOR) 20 MG tablet    Sig: Take 1 tablet (20 mg total) by mouth daily.    Dispense:  90 tablet    Refill:  3   midodrine (PROAMATINE) 2.5 MG tablet    Sig: Take 1 tablet (2.5 mg total) by mouth 3 (three) times daily with meals.    Dispense:  270 tablet    Refill:  3   Patient Instructions  Medication Instructions:  Please discontinue your Simvastatin. Start Crestor 20 mg once daily. Start Midodrine 2.5 mg one tablet three times a day with meals. Continue all other medications as listed.  *If you need a refill on your cardiac medications before your next appointment, please call your pharmacy*  Follow-Up: At [facility], you and your health needs are our priority.  As part of our continuing mission to provide you with exceptional heart care, we have created designated Provider Care Teams.  These Care Teams include your primary Cardiologist (physician) and Advanced Practice Providers (APPs -  Physician Assistants and Nurse Practitioners) who all work together to provide you with  the care you need, when you need it.  We recommend signing up for the patient portal called "MyChart".  Sign up information is provided on this After Visit Summary.  MyChart is used to connect with patients for Virtual Visits (Telemedicine).  Patients are able to view lab/test results, encounter notes, upcoming appointments, etc.  Non-urgent messages can be sent to your provider as well.   To learn more about what you can do with MyChart, go to [email or web address].    Your next appointment:   3 month(s)  The format for your next appointment:   In Person  Provider:   Robbie Lis, PA-C, Melina Copa, PA-C, Ermalinda Barrios, PA-C, Christen Bame, NP, or Richardson Dopp, PA-C     Then, Candee Furbish, MD will plan to see you again in 1 year(s).    Thank you for choosing [facility]!!   Midodrine tablets What is this medication? MIDODRINE (MI doe dreen) is used to treat low blood pressure in patients who have symptoms like dizziness when going from a sitting to a standing position. This medicine may be used for other purposes; ask your health care provider or pharmacist if you have questions. COMMON BRAND NAME(S): Orvaten, ProAmatine What should I tell my care team before I take this medication? They need to know if you have any of the following conditions: difficulty passing urine heart disease high blood pressure kidney disease over active thyroid pheochromocytoma an unusual or allergic reaction to midodrine, other medicines, foods, dyes, or preservatives pregnant or trying to get pregnant breast-feeding How should I use this medication? Take this medicine by mouth with a glass of water. Follow the directions on the prescription label. The last dose of this medicine should not be taken after the evening meal or less than 4 hours before bedtime. When you lie down for any length of time after taking this medicine, high blood pressure can occur. Do not take this medicine if you will  be lying down for any length of time. Do not take  your medicine more often than directed. Do not stop taking except on your doctor's advice. Talk to your pediatrician regarding the use of this medicine in children. Special care may be needed. Overdosage: If you think you have taken too much of this medicine contact a poison control center or emergency room at once. NOTE: This medicine is only for you. Do not share this medicine with others. What if I miss a dose? If you miss a dose, take it as soon as you can. If it is almost time for your next dose, take only that dose. Do not take double or extra doses. What may interact with this medication? Do not take this medicine with any of the following medications: MAOIs like Carbex, Eldepryl, Marplan, Nardil, and Parnate medicines called ergot alkaloids medicines for colds and breathing difficulties or weight loss procarbazine This medicine may also interact with the following medications: cimetidine digoxin flecainide fludrocortisone metformin procainamide quinidine ranitidine triamterene medicines called alpha-blockers like doxazosin, prazosin, and terazosin This list may not describe all possible interactions. Give your health care provider a list of all the medicines, herbs, non-prescription drugs, or dietary supplements you use. Also tell them if you smoke, drink alcohol, or use illegal drugs. Some items may interact with your medicine. What should I watch for while using this medication? Visit your doctor or health care professional for regular checks on your progress. You may get drowsy or dizzy. Do not drive, use machinery, or do anything that needs mental alertness until you know how this medicine affects you. Do not stand or sit up quickly, especially if you are an older patient. This reduces the risk of dizzy or fainting spells. Your mouth may get dry. Chewing sugarless gum or sucking hard candy, and drinking plenty of water may help.  Contact your doctor if the problem does not go away or is severe. Do not treat yourself for coughs, colds, or pain while you are taking this medicine without asking your doctor or health care professional for advice. Some ingredients may increase your blood pressure. What side effects may I notice from receiving this medication? Side effects that you should report to your doctor or health care professional as soon as possible: awareness of heart beating blurred vision headache irregular heartbeat, palpitations, or chest pain pounding in the ears skin rash, hives Side effects that usually do not require medical attention (report to your doctor or health care professional if they continue or are bothersome): change in heart rate chills goose bumps increased need to urinate itching stomach pain tingling in the skin or scalp This list may not describe all possible side effects. Call your doctor for medical advice about side effects. You may report side effects to FDA at 1-800-FDA-1088. Where should I keep my medication? Keep out of the reach of children. Store at room temperature between 15 and 30 degrees C (59 and 86 degrees F). Throw away any unused medicine after the expiration date. NOTE: This sheet is a summary. It may not cover all possible information. If you have questions about this medicine, talk to your doctor, pharmacist, or health care provider.  2022 Elsevier/Gold Standard (2008-01-11 00:00:00)  Rosuvastatin Tablets What is this medication? ROSUVASTATIN (roe SOO va sta tin) treats high cholesterol and reduces the risk of heart attack and stroke. It works by decreasing bad cholesterol and fats (such as LDL, triglycerides), and increasing good cholesterol (HDL) in your blood. It belongs to a group of medications called statins. Changes to diet and  exercise are often combined with this medication. This medicine may be used for other purposes; ask your health care provider or  pharmacist if you have questions. COMMON BRAND NAME(S): Crestor What should I tell my care team before I take this medication? They need to know if you have any of these conditions: Diabetes (high blood sugar) If you often drink alcohol Kidney disease Liver disease Muscle cramps, pain Stroke Thyroid disease An unusual or allergic reaction to rosuvastatin, other medications, foods, dyes, or preservatives Pregnant or trying to get pregnant Breast-feeding How should I use this medication? Take this medication by mouth with a glass of water. Follow the directions on the prescription label. You can take it with or without food. If it upsets your stomach, take it with food. Do not cut, crush or chew this medication. Swallow the tablets whole. Take your medication at regular intervals. Do not take it more often than directed. Take antacids that have a combination of aluminum and magnesium hydroxide in them at a different time of day than this medication. Take these products 2 hours AFTER this medication. Talk to your care team about the use of this medication in children. While this medication may be prescribed for children as young as 7 for selected conditions, precautions do apply. Overdosage: If you think you have taken too much of this medicine contact a poison control center or emergency room at once. NOTE: This medicine is only for you. Do not share this medicine with others. What if I miss a dose? If you miss a dose, take it as soon as you can. If your next dose is to be taken in less than 12 hours, then do not take the missed dose. Take the next dose at your regular time. Do not take double or extra doses. What may interact with this medication? Do not take this medication with any of the following: Supplements like red yeast rice This medication may also interact with the following: Alcohol Antacids containing aluminum hydroxide and magnesium hydroxide Cyclosporine Other medications  for high cholesterol Some medications for HIV infection Warfarin This list may not describe all possible interactions. Give your health care provider a list of all the medicines, herbs, non-prescription drugs, or dietary supplements you use. Also tell them if you smoke, drink alcohol, or use illegal drugs. Some items may interact with your medicine. What should I watch for while using this medication? Visit your health care provider for regular checks on your progress. Tell your health care provider if your symptoms do not start to get better or if they get worse. Your health care provider may tell you to stop taking this medication if you develop muscle problems. If your muscle problems do not go away after stopping this medication, contact your health care provider. Do not become pregnant while taking this medication. Women should inform their health care provider if they wish to become pregnant or think they might be pregnant. There is potential for serious harm to an unborn child. Talk to your health care provider for more information. Do not breast-feed an infant while taking this medication. This medication may increase blood sugar. Ask your health care provider if changes in diet or medications are needed if you have diabetes. If you are going to need surgery or other procedure, tell your health care provider that you are using this medication. Taking this medication is only part of a total heart healthy program. Your health care provider may give you a special diet to  follow. Avoid alcohol. Avoid smoking. Ask your health care provider how much you should exercise. What side effects may I notice from receiving this medication? Side effects that you should report to your care team as soon as possible: Allergic reactions--skin rash, itching, hives, swelling of the face, lips, tongue, or throat High blood sugar (hyperglycemia)--increased thirst or amount of urine, unusual weakness, fatigue, blurry  vision Liver injury--right upper belly pain, loss of appetite, nausea, light-colored stool, dark yellow or brown urine, yellowing skin or eyes, unusual weakness, fatigue Muscle injury--unusual weakness, fatigue, muscle pain, dark yellow or brown urine, decrease in amount of urine Redness, blistering, peeling or loosening of the skin, including inside the mouth Side effects that usually do not require medical attention (report to your care team if they continue or are bothersome): Fatigue Headache Nausea Stomach pain This list may not describe all possible side effects. Call your doctor for medical advice about side effects. You may report side effects to FDA at 1-800-FDA-1088. Where should I keep my medication? Keep out of the reach of children and pets. Store between 20 and 25 degrees C (68 and 77 degrees F). Get rid of any unused medication after the expiration date. To get rid of medications that are no longer needed or have expired: Take the medication to a medication take-back program. Check with your pharmacy or law enforcement to find a location. If you cannot return the medication, check the label or package insert to see if the medication should be thrown out in the garbage or flushed down the toilet. If you are not sure, ask your care team. If it is safe to put it in the trash, take the medication out of the container. Mix the medication with cat litter, dirt, coffee grounds, or other unwanted substance. Seal the mixture in a bag or container. Put it in the trash. NOTE: This sheet is a summary. It may not cover all possible information. If you have questions about this medicine, talk to your doctor, pharmacist, or health care provider.  2022 Elsevier/Gold Standard (2020-06-14 00:00:00)    I,Mathew Stumpf,acting as a scribe for [employer], MD.,have documented all relevant documentation on the behalf of Candee Furbish, MD,as directed by  Candee Furbish, MD while in the presence of Candee Furbish, MD.  I, Candee Furbish, MD, have reviewed all documentation for this visit. The documentation on 06/26/21 for the exam, diagnosis, procedures, and orders are all accurate and complete.   Signed, Candee Furbish, MD  06/26/2021 12:08 PM    [place]

## 2021-07-01 ENCOUNTER — Telehealth: Payer: Self-pay | Admitting: Cardiology

## 2021-07-01 NOTE — Telephone Encounter (Signed)
New Message:   Wife says patient says he goes to a program called [employer]. She says he will need a letter stating that they  may give him Midodrine the medicine that Dr Marlou Porch prescribed.

## 2021-07-04 ENCOUNTER — Encounter: Payer: Self-pay | Admitting: *Deleted

## 2021-07-04 NOTE — Telephone Encounter (Signed)
Letter for [employer] completed.  Per wife - should be [employer].  She will ask for a fax # for it to be sent to.

## 2021-07-10 NOTE — Telephone Encounter (Signed)
Left message for wife to call back with fax # to send letter she requested to.

## 2021-07-10 NOTE — Telephone Encounter (Signed)
Patient's wife calling back with fax number. Fax: [phone] Attention Simmie Davies

## 2021-07-14 NOTE — Telephone Encounter (Signed)
Letter faxed 07/10/21 as requested.

## 2021-07-15 ENCOUNTER — Telehealth: Payer: Self-pay | Admitting: Cardiology

## 2021-07-15 NOTE — Telephone Encounter (Signed)
° °  Dylan [name] with [phone] calling, she said, they faxed new drug clarification for Midodrine, she apologized for the inconvenience  but she said they need everything clarified and verified

## 2021-07-15 NOTE — Telephone Encounter (Signed)
Spoke with Marcene Brawn - she reports she faxed an order for MD to sign regarding pt's Midodrine RX.  Advised I have not received it at this time but will look for it and have MD to sign

## 2021-07-15 NOTE — Telephone Encounter (Signed)
Paperwork obtained, signed by Dr Marlou Porch and faxed back as requested.

## 2021-07-17 DIAGNOSIS — F0280 Dementia in other diseases classified elsewhere without behavioral disturbance: Secondary | ICD-10-CM | POA: Diagnosis not present

## 2021-07-17 DIAGNOSIS — G301 Alzheimer's disease with late onset: Secondary | ICD-10-CM | POA: Diagnosis not present

## 2021-09-03 DIAGNOSIS — E7800 Pure hypercholesterolemia, unspecified: Secondary | ICD-10-CM | POA: Diagnosis not present

## 2021-09-03 DIAGNOSIS — R7302 Impaired glucose tolerance (oral): Secondary | ICD-10-CM | POA: Diagnosis not present

## 2021-09-03 DIAGNOSIS — Z125 Encounter for screening for malignant neoplasm of prostate: Secondary | ICD-10-CM | POA: Diagnosis not present

## 2021-09-03 DIAGNOSIS — R7989 Other specified abnormal findings of blood chemistry: Secondary | ICD-10-CM | POA: Diagnosis not present

## 2021-09-10 DIAGNOSIS — I951 Orthostatic hypotension: Secondary | ICD-10-CM | POA: Diagnosis not present

## 2021-09-10 DIAGNOSIS — Z1339 Encounter for screening examination for other mental health and behavioral disorders: Secondary | ICD-10-CM | POA: Diagnosis not present

## 2021-09-10 DIAGNOSIS — N3010 Interstitial cystitis (chronic) without hematuria: Secondary | ICD-10-CM | POA: Diagnosis not present

## 2021-09-10 DIAGNOSIS — Z0000 Encounter for general adult medical examination without abnormal findings: Secondary | ICD-10-CM | POA: Diagnosis not present

## 2021-09-10 DIAGNOSIS — R972 Elevated prostate specific antigen [PSA]: Secondary | ICD-10-CM | POA: Diagnosis not present

## 2021-09-10 DIAGNOSIS — R7302 Impaired glucose tolerance (oral): Secondary | ICD-10-CM | POA: Diagnosis not present

## 2021-09-10 DIAGNOSIS — F419 Anxiety disorder, unspecified: Secondary | ICD-10-CM | POA: Diagnosis not present

## 2021-09-10 DIAGNOSIS — F03A0 Unspecified dementia, mild, without behavioral disturbance, psychotic disturbance, mood disturbance, and anxiety: Secondary | ICD-10-CM | POA: Diagnosis not present

## 2021-09-10 DIAGNOSIS — K219 Gastro-esophageal reflux disease without esophagitis: Secondary | ICD-10-CM | POA: Diagnosis not present

## 2021-09-10 DIAGNOSIS — E7800 Pure hypercholesterolemia, unspecified: Secondary | ICD-10-CM | POA: Diagnosis not present

## 2021-09-10 DIAGNOSIS — N401 Enlarged prostate with lower urinary tract symptoms: Secondary | ICD-10-CM | POA: Diagnosis not present

## 2021-09-10 DIAGNOSIS — D692 Other nonthrombocytopenic purpura: Secondary | ICD-10-CM | POA: Diagnosis not present

## 2021-09-10 DIAGNOSIS — Z1331 Encounter for screening for depression: Secondary | ICD-10-CM | POA: Diagnosis not present

## 2021-09-10 DIAGNOSIS — D696 Thrombocytopenia, unspecified: Secondary | ICD-10-CM | POA: Diagnosis not present

## 2021-09-17 ENCOUNTER — Ambulatory Visit (INDEPENDENT_AMBULATORY_CARE_PROVIDER_SITE_OTHER): Payer: PPO | Admitting: Ophthalmology

## 2021-09-17 ENCOUNTER — Encounter (INDEPENDENT_AMBULATORY_CARE_PROVIDER_SITE_OTHER): Payer: Self-pay | Admitting: Ophthalmology

## 2021-09-17 DIAGNOSIS — H33321 Round hole, right eye: Secondary | ICD-10-CM | POA: Diagnosis not present

## 2021-09-17 DIAGNOSIS — H43813 Vitreous degeneration, bilateral: Secondary | ICD-10-CM | POA: Diagnosis not present

## 2021-09-17 NOTE — Assessment & Plan Note (Signed)
No new breaks OU ?

## 2021-09-17 NOTE — Assessment & Plan Note (Signed)
OD stable no new breaks ?

## 2021-09-17 NOTE — Progress Notes (Signed)
? ? ?09/17/2021 ? ?  ? ?CHIEF COMPLAINT ?Patient presents for  ?Chief Complaint  ?Patient presents with  ? Posterior Vitreous Detachment  ? ? ? ? ?HISTORY OF PRESENT ILLNESS: ?Dylan [name] is a 86 y.o. male who presents to the clinic today for:  ? ?HPI   ?1 yr fu OU FP. ?Pt states "I have blurred vision." Pt wife states "he had erythromycin because he has inflamed lashes, from Dr. Midge Aver." Patient states he has floaters in both eyes, stable. ?Denies hospitalizations, surgeries, or falls in the past year. ?Last edited by Laurin Coder on 09/17/2021  1:24 PM.  ?  ? ? ?Referring physician: ?Warden Fillers, MD ?[address] ?[address] ?[place],  [place] [id number] ? ?HISTORICAL INFORMATION:  ? ?Selected notes from the Progress ?  ?   ? ?CURRENT MEDICATIONS: ?No current outpatient medications on file. (Ophthalmic Drugs)  ? ?No current facility-administered medications for this visit. (Ophthalmic Drugs)  ? ?Current Outpatient Medications (Other)  ?Medication Sig  ? famotidine (PEPCID) 20 MG tablet Take 1 tablet (20 mg total) by mouth daily.  ? memantine (NAMENDA) 10 MG tablet TAKE 1 TABLET BY MOUTH TWICE A DAY  ? midodrine (PROAMATINE) 2.5 MG tablet Take 1 tablet (2.5 mg total) by mouth 3 (three) times daily with meals.  ? potassium citrate (UROCIT-K) 10 MEQ (1080 MG) SR tablet Take 2 tablets by mouth 2 (two) times daily.   ? rivastigmine (EXELON) 9.5 mg/24hr Place 9.5 mg onto the skin daily.  ? rosuvastatin (CRESTOR) 20 MG tablet Take 1 tablet (20 mg total) by mouth daily.  ? sertraline (ZOLOFT) 50 MG tablet Take 100 mg by mouth daily.   ? tamsulosin (FLOMAX) 0.4 MG CAPS capsule Take 0.4 mg by mouth daily.  ? Turmeric 500 MG CAPS Take 1 capsule by mouth daily.  ? ?No current facility-administered medications for this visit. (Other)  ? ? ? ? ?REVIEW OF SYSTEMS: ?ROS   ?Negative for: Constitutional, Gastrointestinal, Neurological, Skin, Genitourinary, Musculoskeletal, HENT, Endocrine, Cardiovascular, Eyes,  Respiratory, Psychiatric, Allergic/Imm, Heme/Lymph ?Last edited by Hurman Horn, MD on 09/17/2021  1:38 PM.  ?  ? ? ? ?ALLERGIES ?No Known Allergies ? ?PAST MEDICAL HISTORY ?Past Medical History:  ?Diagnosis Date  ? High cholesterol   ? Kidney stone   ? Memory loss   ? Sleep apnea   ? NPSG 08-03-07 AHI 10/hr, RDI 19.7/hr  ? ?Past Surgical History:  ?Procedure Laterality Date  ? right clavicle repair  1954  ? fx  ? TONSILLECTOMY  1942  ? [place]  ? ? ?FAMILY HISTORY ?Family History  ?Problem Relation Age of Onset  ? Heart disease Father   ? Stroke Father   ? Cancer Mother   ? Pulmonary embolism Mother   ? ? ?SOCIAL HISTORY ?Social History  ? ?Tobacco Use  ? Smoking status: Never  ? Smokeless tobacco: Never  ?Vaping Use  ? Vaping Use: Never used  ?Substance Use Topics  ? Alcohol use: Yes  ?  Alcohol/week: 1.0 standard drink  ?  Types: 1 Glasses of wine per week  ?  Comment: Socially; not weekly use  ? Drug use: No  ? ?  ? ?  ? ?OPHTHALMIC EXAM: ? ?Base Eye Exam   ? ? Visual Acuity (ETDRS)   ? ?   Right Left  ? Dist [place] 20/30 +2 20/20 -2  ? ?  ?  ? ? Tonometry (Tonopen, 1:22 PM)   ? ?  Right Left  ? Pressure 4 11  ? ?  ?  ? ? Pupils   ? ?   Pupils Dark Light APD  ? Right PERRL 3 2 None  ? Left PERRL 3 2 None  ? ?  ?  ? ? Visual Fields (Counting fingers)   ? ?   Left Right  ?  Full Full  ? ?  ?  ? ? Extraocular Movement   ? ?   Right Left  ?  Full Full  ? ?  ?  ? ? Neuro/Psych   ? ? Oriented x3: Yes  ? Mood/Affect: Normal  ? ?  ?  ? ? Dilation   ? ? Both eyes: 1.0% Mydriacyl, 2.5% Phenylephrine @ 1:22 PM  ? ?  ?  ? ?  ? ?Slit Lamp and Fundus Exam   ? ? External Exam   ? ?   Right Left  ? External Normal Normal  ? ?  ?  ? ? Slit Lamp Exam   ? ?   Right Left  ? Lids/Lashes Normal Normal  ? Conjunctiva/Sclera White and quiet White and quiet  ? Cornea Clear Clear  ? Anterior Chamber Deep and quiet Deep and quiet  ? Iris Round and reactive Round and reactive  ? Lens Posterior chamber intraocular lens  Posterior chamber intraocular lens  ? Anterior Vitreous Normal Normal  ? ?  ?  ? ? Fundus Exam   ? ?   Right Left  ? Posterior Vitreous Posterior vitreous detachment Posterior vitreous detachment  ? Disc Normal Normal  ? C/D Ratio 0.1 0.3  ? Macula Normal Normal  ? Vessels Normal Normal  ? Periphery Normal, no breaks Normal, no breaks  ? ?  ?  ? ?  ? ? ?IMAGING AND PROCEDURES  ?Imaging and Procedures for 09/17/21 ? ?Color Fundus Photography Optos - OU - Both Eyes   ? ?   ?Right Eye ?Progression has improved. Disc findings include normal observations. Macula : normal observations. Vessels : normal observations. Periphery : normal observations.  ? ?Left Eye ?Progression has been stable. Disc findings include normal observations. Macula : normal observations. Vessels : normal observations. Periphery : normal observations.  ? ?Notes ?OU, with posterior vitreous detachment.,  Clear media.  No signs of glaucoma damage. ? ?  ? ? ?  ?  ? ?  ?ASSESSMENT/PLAN: ? ?Posterior vitreous detachment of both eyes ?No new breaks OU ? ?Round hole of right eye ?OD stable no new breaks  ? ?  ICD-10-CM   ?1. Posterior vitreous detachment of both eyes  H43.813 Color Fundus Photography Optos - OU - Both Eyes  ?  ?2. Round hole of right eye  H33.321   ?  ? ? ?1. ? ?2. ? ?3. ? ?Ophthalmic Meds Ordered this visit:  ?No orders of the defined types were placed in this encounter. ? ? ?  ? ?Return in about 1 year (around 09/18/2022) for DILATE OU, COLOR FP, OCT. ? ?There are no Patient Instructions on file for this visit. ? ? ?Explained the diagnoses, plan, and follow up with the patient and they expressed understanding.  Patient expressed understanding of the importance of proper follow up care.  ? ?Clent Demark. Tayt Moyers M.D. ?Diseases & Surgery of the Retina and Vitreous ?[place] ?09/17/21 ? ? ? ? ?Abbreviations: ?M myopia (nearsighted); A astigmatism; H hyperopia (farsighted); P presbyopia; Mrx spectacle prescription;  CTL contact  lenses; OD right eye;  OS left eye; OU both eyes  XT exotropia; ET esotropia; PEK punctate epithelial keratitis; PEE punctate epithelial erosions; DES dry eye syndrome; MGD meibomian gland dysfunction; ATs artificial tears; PFAT's preservative free artificial tears; [place] nuclear sclerotic cataract; PSC posterior subcapsular cataract; ERM epi-retinal membrane; PVD posterior vitreous detachment; RD retinal detachment; DM diabetes mellitus; DR diabetic retinopathy; NPDR non-proliferative diabetic retinopathy; PDR proliferative diabetic retinopathy; CSME clinically significant macular edema; DME diabetic macular edema; dbh dot blot hemorrhages; CWS cotton wool spot; POAG primary open angle glaucoma; C/D cup-to-disc ratio; HVF humphrey visual field; GVF goldmann visual field; OCT optical coherence tomography; IOP intraocular pressure; BRVO Branch retinal vein occlusion; CRVO central retinal vein occlusion; CRAO central retinal artery occlusion; BRAO branch retinal artery occlusion; RT retinal tear; SB scleral buckle; PPV pars plana vitrectomy; VH Vitreous hemorrhage; PRP panretinal laser photocoagulation; IVK intravitreal kenalog; VMT vitreomacular traction; MH Macular hole;  NVD neovascularization of the disc; NVE neovascularization elsewhere; AREDS age related eye disease study; ARMD age related macular degeneration; POAG primary open angle glaucoma; EBMD epithelial/anterior basement membrane dystrophy; ACIOL anterior chamber intraocular lens; IOL intraocular lens; PCIOL posterior chamber intraocular lens; Phaco/IOL phacoemulsification with intraocular lens placement; [place] photorefractive keratectomy; LASIK laser assisted in situ keratomileusis; HTN hypertension; DM diabetes mellitus; COPD chronic obstructive pulmonary disease ?

## 2021-09-19 DIAGNOSIS — N400 Enlarged prostate without lower urinary tract symptoms: Secondary | ICD-10-CM | POA: Diagnosis not present

## 2021-09-19 DIAGNOSIS — R972 Elevated prostate specific antigen [PSA]: Secondary | ICD-10-CM | POA: Diagnosis not present

## 2021-09-19 DIAGNOSIS — N200 Calculus of kidney: Secondary | ICD-10-CM | POA: Diagnosis not present

## 2021-09-23 NOTE — Progress Notes (Signed)
?Cardiology Office Note:   ? ?Date:  09/24/2021  ? ?ID:  Dylan [name], DOB 04/01/1936, MRN [id number] ? ?PCP:  Dylan Pao, MD ?  ?[facility] Providers ?Cardiologist:  Dylan Furbish, MD ?Click to update primary MD,subspecialty MD or APP then REFRESH:1}   ? ?Referring MD: Dylan Pao, MD  ? ?Chief Complaint: Follow-up hypotension ? ?History of Present Illness:   ? ?Dylan [name] is a 86 y.o. male with a hx of hypotension, chronic HFrEF (45-50%) hyperlipidemia, cerebral amyloid angiopathy, and RBBB. ? ?Established care in 2019 for evaluation of sudden vision loss, referred by PCP. Reported that he was walking and fell, feet came out from under him. Stopped driving around that time. Experienced darkened vision transiently for a few seconds, difficult to quantify. Cardiac monitor revealed NSR, mild sinus bradycardia during sleep, no pauses, no atrial fibrillation, no adverse arrhythmias.  ? ?Last seen by Dr. Marlou [name] on 06/26/21 with reported dizziness and started on midodrine for BP 90/50 mmHg. Switched from simvastatin 80 mg to Crestor 20 mg. Followed at [facility] for cerebral amyloid angiopathy. Advised to follow-up in 3 months.  ? ?Today, he is here with his wife.  She reports he is unsteadiness seems to have improved.  She does not have consistent BP readings to report.  When asked, the patient had difficulty verbalizing how he is feeling.  He is active in 2 daytime groups that provide socialization and various activities for the participants.  Wife states that is not a very physically active group.  There has been no concern from the group liters for patient's unsteadiness or high risk of falling.  Patient and his wife have no specific concerns today.  ? ?Past Medical History:  ?Diagnosis Date  ? High cholesterol   ? Kidney stone   ? Memory loss   ? Sleep apnea   ? NPSG 08-03-07 AHI 10/hr, RDI 19.7/hr  ? ? ?Past Surgical History:  ?Procedure Laterality Date  ? right clavicle repair  1954  ? fx  ?  TONSILLECTOMY  1942  ? [place]  ? ? ?Current Medications: ?Current Meds  ?Medication Sig  ? Cholecalciferol (VITAMIN D3 PO) Take 50 mg by mouth daily.  ? cyanocobalamin 1000 MCG tablet Take 1,000 mcg by mouth daily.  ? famotidine (PEPCID) 20 MG tablet Take 1 tablet (20 mg total) by mouth daily.  ? Melatonin 5 MG TBDP Take 5 mg by mouth at bedtime.  ? memantine (NAMENDA) 10 MG tablet TAKE 1 TABLET BY MOUTH TWICE A DAY  ? midodrine (PROAMATINE) 2.5 MG tablet Take 1 tablet (2.5 mg total) by mouth 3 (three) times daily with meals.  ? potassium citrate (UROCIT-K) 10 MEQ (1080 MG) SR tablet Take 2 tablets by mouth 2 (two) times daily.   ? rivastigmine (EXELON) 9.5 mg/24hr Place 9.5 mg onto the skin daily.  ? rosuvastatin (CRESTOR) 20 MG tablet Take 1 tablet (20 mg total) by mouth daily.  ? sertraline (ZOLOFT) 50 MG tablet Take 100 mg by mouth daily.   ? tamsulosin (FLOMAX) 0.4 MG CAPS capsule Take 0.4 mg by mouth daily.  ? Turmeric 500 MG CAPS Take 1 capsule by mouth daily.  ?  ? ?Allergies:   Patient has no known allergies.  ? ?Social History  ? ?Socioeconomic History  ? Marital status: Married  ?  Spouse name: Dylan [name]   ? Number of children: 3  ? Years of education: 12+  ? Highest education level: Not on  file  ?Occupational History  ? Occupation: Real Estate-rental properties  ?Tobacco Use  ? Smoking status: Never  ? Smokeless tobacco: Never  ?Vaping Use  ? Vaping Use: Never used  ?Substance and Sexual Activity  ? Alcohol use: Yes  ?  Alcohol/week: 1.0 standard drink  ?  Types: 1 Glasses of wine per week  ?  Comment: Socially; not weekly use  ? Drug use: No  ? Sexual activity: Not on file  ?Other Topics Concern  ? Not on file  ?Social History Narrative  ? Lives at home with wife, Dylan [name].  ? Caffeine use: 2 cups of tea daily  ? Right handed  ? ?Social Determinants of Health  ? ?Financial Resource Strain: Not on file  ?Food Insecurity: Not on file  ?Transportation Needs: Not on file  ?Physical Activity: Not  on file  ?Stress: Not on file  ?Social Connections: Not on file  ?  ? ?Family History: ?The patient's family history includes Cancer in his mother; Heart disease in his father; Pulmonary embolism in his mother; Stroke in his father. ? ?ROS:   ?Please see the history of present illness.  All other systems reviewed and are negative. ? ?Labs/Other Studies Reviewed:   ? ?The following studies were reviewed today: ? ?Cardiac monitor 09/2017 ? ?Normal sinus rhythm. ?Mild sinus bradycardia during sleep (expected) ?No pauses ?No atrial fibrillation ?No adverse arrhythmias ?  ?Reassuring monitor. ? ?Echo 09/2017 ? ?Left ventricle:  The cavity size was normal. Wall thickness was  ?normal. Systolic function was mildly reduced. The estimated  ?ejection fraction was in the range of 45% to 50%.  Mild diffuse  ?hypokinesis with no identifiable regional variations. Doppler  ?parameters are consistent with abnormal left ventricular relaxation  ?(grade 1 diastolic dysfunction). There was no evidence of elevated  ?ventricular filling pressure by Doppler parameters.  ?Aortic valve:   Structurally normal valve.   Cusp separation was  ?normal.  Doppler:  Transvalvular velocity was within the normal  ?range. There was no stenosis. There was no regurgitation.  ?Aorta: Aortic root: The aortic root was normal in size.  ?Ascending aorta: The ascending aorta was normal in size.  ?Mitral valve:   Normal thickness leaflets . Leaflet separation was  ?normal.  Systolic bowing without prolapse.  Doppler:  Transvalvular  ?velocity was within the normal range. There was no evidence for  ?stenosis. There was trivial regurgitation.  ?Left atrium:  The atrium was normal in size.  ?Right ventricle:  The cavity size was normal. Systolic function was  ?normal.  ?Pulmonic valve:   Poorly visualized.  ?Tricuspid valve:  Poorly visualized.  Doppler:  There was no  ?significant regurgitation.  ?Pulmonary artery:    Systolic pressure could not be accurately   ?estimated.  ?Right atrium:  The atrium was normal in size.  ?Pericardium: There was no pericardial effusion.  ?Systemic veins:  ?Inferior vena cava: The vessel was dilated. The respirophasic  ?diameter changes were blunted (< 50%), consistent with elevated  ?central venous pressure.  ? ?Recent Labs: ?From PCP 09/03/21 ?PSA 15.773 ?Creatinine 1.4, GFR 48.3 ?Sodium 140, K+ 4.3 ?AST 17, ALT 13 ?Hgb 14.6, PLT 150 ?A1C 5.2 ? ?Recent Lipid Panel ?Trigs 108 ?HDL 40 ?LDL 72 ? ? ?Risk Assessment/Calculations:   ?  ? ? ?Physical Exam:   ? ?VS:  BP 110/60 (BP Location: Left Arm, Patient Position: Sitting, Cuff Size: Normal)   Pulse 75   Ht '5\' 10"'$  (1.778 m)   Wt 177 lb (  80.3 kg)   SpO2 95%   BMI 25.40 kg/m?    ? ?Wt Readings from Last 3 Encounters:  ?09/24/21 177 lb (80.3 kg)  ?06/26/21 174 lb (78.9 kg)  ?05/20/21 176 lb 12.8 oz (80.2 kg)  ?  ? ?GEN:  Well nourished, well developed in no acute distress ?HEENT: Normal ?NECK: No JVD; No carotid bruits ?CARDIAC: RRR, no murmurs, rubs, gallops ?RESPIRATORY:  Clear to auscultation without rales, wheezing or rhonchi  ?ABDOMEN: Soft, non-tender, non-distended ?MUSCULOSKELETAL:  No edema; No deformity. 2+ pedal pulses, equal bilaterally ?SKIN: Warm and dry ?NEUROLOGIC:  Alert and oriented x 3 ?PSYCHIATRIC:  Normal affect, pleasant. Mostly nonverbal. Able to follow simple commands.  ? ?EKG:  EKG is not ordered today.  ? ?Diagnoses:   ? ?1. Cerebral amyloid angiopathy ([place])   ?2. Orthostatic hypotension   ?3. Right bundle branch block   ? ?Assessment and Plan:   ? ? ?Orthostatic hypotension: BP today is 110/60 mmHg. Wife does not have any home readings to report. She states she feels that he is doing better and has less episodes of unsteadiness.  We discussed increasing midodrine to keep BP less than 140/80.  She would like to continue to monitor.  We gave her instructions on giving him to 2.5 mg tablets if BP consistently <110 mmHg. if dose is changed, she will need a letter to the  adult support program where patient spends some of his time during the day. Lab results from PCP reviewed.  ? ?Cerebral amyloid angiopathy/Dementia: He is mostly nonverbal, although able to follow simple commands. [place]

## 2021-09-24 ENCOUNTER — Other Ambulatory Visit: Payer: Self-pay | Admitting: Urology

## 2021-09-24 ENCOUNTER — Encounter: Payer: Self-pay | Admitting: Nurse Practitioner

## 2021-09-24 ENCOUNTER — Ambulatory Visit: Payer: PPO | Admitting: Nurse Practitioner

## 2021-09-24 VITALS — BP 110/60 | HR 75 | Ht 70.0 in | Wt 177.0 lb

## 2021-09-24 DIAGNOSIS — I951 Orthostatic hypotension: Secondary | ICD-10-CM

## 2021-09-24 DIAGNOSIS — E854 Organ-limited amyloidosis: Secondary | ICD-10-CM

## 2021-09-24 DIAGNOSIS — I4510 Unspecified right bundle-branch block: Secondary | ICD-10-CM | POA: Diagnosis not present

## 2021-09-24 DIAGNOSIS — I680 Cerebral amyloid angiopathy: Secondary | ICD-10-CM | POA: Diagnosis not present

## 2021-09-24 DIAGNOSIS — R972 Elevated prostate specific antigen [PSA]: Secondary | ICD-10-CM

## 2021-09-24 NOTE — Patient Instructions (Signed)
Medication Instructions:  ?Your physician recommends that you continue on your current medications as directed. Please refer to the Current Medication list given to you today. ?MONITOR HIS BLOOD PRESSURE, IF THE TOP NUMBER IS LESS THAN 110, YOU CAN GIVE AN ADDITIONAL 2.5 MG OF MIDODRINE  ? ?*If you need a refill on your cardiac medications before your next appointment, please call your pharmacy* ? ? ?Lab Work: ?None ordered ? ?If you have labs (blood work) drawn today and your tests are completely normal, you will receive your results only by: ?MyChart Message (if you have MyChart) OR ?A paper copy in the mail ?If you have any lab test that is abnormal or we need to change your treatment, we will call you to review the results. ? ? ?Testing/Procedures: ?None ordered ? ? ?Follow-Up: ?At [facility], you and your health needs are our priority.  As part of our continuing mission to provide you with exceptional heart care, we have created designated Provider Care Teams.  These Care Teams include your primary Cardiologist (physician) and Advanced Practice Providers (APPs -  Physician Assistants and Nurse Practitioners) who all work together to provide you with the care you need, when you need it. ? ?We recommend signing up for the patient portal called "MyChart".  Sign up information is provided on this After Visit Summary.  MyChart is used to connect with patients for Virtual Visits (Telemedicine).  Patients are able to view lab/test results, encounter notes, upcoming appointments, etc.  Non-urgent messages can be sent to your provider as well.   ?To learn more about what you can do with MyChart, go to [email or web address].   ? ?Your next appointment:   ?6 month(s) ? ?The format for your next appointment:   ?In Person ? ?Provider:   ?Candee Furbish, MD   ? ? ?Other Instructions ? ? ?Important Information About Sugar ? ? ? ? ?  ?

## 2021-10-14 ENCOUNTER — Ambulatory Visit
Admission: RE | Admit: 2021-10-14 | Discharge: 2021-10-14 | Disposition: A | Discharge status: Home / Self Care | Payer: PPO | Source: Ambulatory Visit | Attending: Urology | Admitting: Urology

## 2021-10-14 DIAGNOSIS — R972 Elevated prostate specific antigen [PSA]: Secondary | ICD-10-CM | POA: Diagnosis not present

## 2021-10-14 MED ORDER — GADOPICLENOL 0.5 MMOL/ML IV SOLN
8.0000 mL | Freq: Once | INTRAVENOUS | Status: AC | PRN
  Administered 2021-10-14: 8 mL via INTRAVENOUS

## 2021-10-30 DIAGNOSIS — R933 Abnormal findings on diagnostic imaging of other parts of digestive tract: Secondary | ICD-10-CM | POA: Diagnosis not present

## 2021-10-30 DIAGNOSIS — R935 Abnormal findings on diagnostic imaging of other abdominal regions, including retroperitoneum: Secondary | ICD-10-CM | POA: Diagnosis not present

## 2021-11-03 ENCOUNTER — Other Ambulatory Visit: Payer: Self-pay | Admitting: Gastroenterology

## 2021-11-04 ENCOUNTER — Other Ambulatory Visit: Payer: Self-pay | Admitting: Gastroenterology

## 2021-11-04 DIAGNOSIS — R109 Unspecified abdominal pain: Secondary | ICD-10-CM

## 2021-11-14 ENCOUNTER — Ambulatory Visit
Admission: RE | Admit: 2021-11-14 | Discharge: 2021-11-14 | Disposition: A | Discharge status: Home / Self Care | Payer: PPO | Source: Ambulatory Visit | Attending: Gastroenterology | Admitting: Gastroenterology

## 2021-11-14 DIAGNOSIS — N200 Calculus of kidney: Secondary | ICD-10-CM | POA: Diagnosis not present

## 2021-11-14 DIAGNOSIS — K5732 Diverticulitis of large intestine without perforation or abscess without bleeding: Secondary | ICD-10-CM | POA: Diagnosis not present

## 2021-11-14 DIAGNOSIS — R109 Unspecified abdominal pain: Secondary | ICD-10-CM

## 2021-11-14 DIAGNOSIS — N400 Enlarged prostate without lower urinary tract symptoms: Secondary | ICD-10-CM | POA: Diagnosis not present

## 2021-11-14 DIAGNOSIS — N320 Bladder-neck obstruction: Secondary | ICD-10-CM | POA: Diagnosis not present

## 2021-12-29 DIAGNOSIS — R972 Elevated prostate specific antigen [PSA]: Secondary | ICD-10-CM | POA: Diagnosis not present

## 2021-12-29 DIAGNOSIS — R3915 Urgency of urination: Secondary | ICD-10-CM | POA: Diagnosis not present

## 2021-12-29 DIAGNOSIS — N401 Enlarged prostate with lower urinary tract symptoms: Secondary | ICD-10-CM | POA: Diagnosis not present

## 2022-02-25 DIAGNOSIS — H40013 Open angle with borderline findings, low risk, bilateral: Secondary | ICD-10-CM | POA: Diagnosis not present

## 2022-02-25 DIAGNOSIS — H16143 Punctate keratitis, bilateral: Secondary | ICD-10-CM | POA: Diagnosis not present

## 2022-02-25 DIAGNOSIS — H0102B Squamous blepharitis left eye, upper and lower eyelids: Secondary | ICD-10-CM | POA: Diagnosis not present

## 2022-02-25 DIAGNOSIS — H01023 Squamous blepharitis right eye, unspecified eyelid: Secondary | ICD-10-CM | POA: Diagnosis not present

## 2022-02-25 DIAGNOSIS — H04123 Dry eye syndrome of bilateral lacrimal glands: Secondary | ICD-10-CM | POA: Diagnosis not present

## 2022-02-25 DIAGNOSIS — H02831 Dermatochalasis of right upper eyelid: Secondary | ICD-10-CM | POA: Diagnosis not present

## 2022-02-25 DIAGNOSIS — D492 Neoplasm of unspecified behavior of bone, soft tissue, and skin: Secondary | ICD-10-CM | POA: Diagnosis not present

## 2022-02-25 DIAGNOSIS — H01026 Squamous blepharitis left eye, unspecified eyelid: Secondary | ICD-10-CM | POA: Diagnosis not present

## 2022-02-25 DIAGNOSIS — H26491 Other secondary cataract, right eye: Secondary | ICD-10-CM | POA: Diagnosis not present

## 2022-02-25 DIAGNOSIS — H02834 Dermatochalasis of left upper eyelid: Secondary | ICD-10-CM | POA: Diagnosis not present

## 2022-02-25 DIAGNOSIS — H53453 Other localized visual field defect, bilateral: Secondary | ICD-10-CM | POA: Diagnosis not present

## 2022-02-25 DIAGNOSIS — H0102A Squamous blepharitis right eye, upper and lower eyelids: Secondary | ICD-10-CM | POA: Diagnosis not present

## 2022-03-04 DIAGNOSIS — F419 Anxiety disorder, unspecified: Secondary | ICD-10-CM | POA: Diagnosis not present

## 2022-03-04 DIAGNOSIS — R7302 Impaired glucose tolerance (oral): Secondary | ICD-10-CM | POA: Diagnosis not present

## 2022-03-04 DIAGNOSIS — F0390 Unspecified dementia without behavioral disturbance: Secondary | ICD-10-CM | POA: Diagnosis not present

## 2022-03-04 DIAGNOSIS — F03A0 Unspecified dementia, mild, without behavioral disturbance, psychotic disturbance, mood disturbance, and anxiety: Secondary | ICD-10-CM | POA: Diagnosis not present

## 2022-03-04 DIAGNOSIS — D692 Other nonthrombocytopenic purpura: Secondary | ICD-10-CM | POA: Diagnosis not present

## 2022-03-04 DIAGNOSIS — E7800 Pure hypercholesterolemia, unspecified: Secondary | ICD-10-CM | POA: Diagnosis not present

## 2022-03-04 DIAGNOSIS — D696 Thrombocytopenia, unspecified: Secondary | ICD-10-CM | POA: Diagnosis not present

## 2022-03-04 DIAGNOSIS — I951 Orthostatic hypotension: Secondary | ICD-10-CM | POA: Diagnosis not present

## 2022-03-04 DIAGNOSIS — N3010 Interstitial cystitis (chronic) without hematuria: Secondary | ICD-10-CM | POA: Diagnosis not present

## 2022-03-04 DIAGNOSIS — Z23 Encounter for immunization: Secondary | ICD-10-CM | POA: Diagnosis not present

## 2022-03-11 DIAGNOSIS — D1801 Hemangioma of skin and subcutaneous tissue: Secondary | ICD-10-CM | POA: Diagnosis not present

## 2022-03-11 DIAGNOSIS — D485 Neoplasm of uncertain behavior of skin: Secondary | ICD-10-CM | POA: Diagnosis not present

## 2022-03-11 DIAGNOSIS — L821 Other seborrheic keratosis: Secondary | ICD-10-CM | POA: Diagnosis not present

## 2022-03-11 DIAGNOSIS — D044 Carcinoma in situ of skin of scalp and neck: Secondary | ICD-10-CM | POA: Diagnosis not present

## 2022-03-11 DIAGNOSIS — C4442 Squamous cell carcinoma of skin of scalp and neck: Secondary | ICD-10-CM | POA: Diagnosis not present

## 2022-03-17 DIAGNOSIS — H26491 Other secondary cataract, right eye: Secondary | ICD-10-CM | POA: Diagnosis not present

## 2022-03-26 ENCOUNTER — Ambulatory Visit: Payer: PPO | Attending: Cardiology | Admitting: Cardiology

## 2022-03-26 ENCOUNTER — Encounter: Payer: Self-pay | Admitting: Cardiology

## 2022-03-26 VITALS — BP 100/60 | HR 69 | Ht 70.0 in | Wt 176.0 lb

## 2022-03-26 DIAGNOSIS — I4510 Unspecified right bundle-branch block: Secondary | ICD-10-CM | POA: Diagnosis not present

## 2022-03-26 DIAGNOSIS — E854 Organ-limited amyloidosis: Secondary | ICD-10-CM

## 2022-03-26 DIAGNOSIS — I951 Orthostatic hypotension: Secondary | ICD-10-CM

## 2022-03-26 DIAGNOSIS — I680 Cerebral amyloid angiopathy: Secondary | ICD-10-CM | POA: Diagnosis not present

## 2022-03-26 MED ORDER — MIDODRINE HCL 5 MG PO TABS: 5.0000 mg | ORAL_TABLET | Freq: Two times a day (BID) | ORAL | 3 refills | Status: DC

## 2022-03-26 MED ORDER — MIDODRINE HCL 5 MG PO TABS
5.0000 mg | ORAL_TABLET | Freq: Two times a day (BID) | ORAL | 3 refills | Status: DC
Start: 2022-03-26 — End: 2022-03-26

## 2022-03-26 NOTE — Progress Notes (Signed)
Cardiology Office Note:    Date:  03/26/2022   ID:  [name], DOB [birth year], MRN [id number]  PCP:  [name], MD  [facility] Cardiologist:  Candee Furbish, MD  [facility] Electrophysiologist:  None   Referring MD: [name], MD    History of Present Illness:    ESIAH BAZINET is a 86 y.o. male here for the follow-up of RBBB and hyperlipidemia.  Advancing memory impairment/dementia.  [facility].  Wife present for historical items.  During his most recent visit with Christen Bame, NP on 09/24/2021 his unsteadiness seemed to have improved. He had still not been taking a consistent blood pressure at home. He had difficulty verbalizing how he is feeling. He had been fairly active  Previously was seen in 2019 after an episode of transient blackened vision.  Has late onset Alzheimer's.  Hard to quantify.  Went to [facility] and had testing performed.  Had previous work-up with MRI/MRA of brain.  Prior uncle died at age 71 from MI.  He never smoked.  At a previous appointment 06/12/2020 he reported nonexertional right/left/central chest discomfort at times-TUMS, Pepto seems to help. GERD like. Right and left and central pain.  Nonexertional.  He was able to walk without difficulty.  During his previous appointment 06/26/2021 he presented with his daughter to provide history. She reported that he becomes dizzy on occasion, with no clear triggers. She thought he wasn't staying adequately hydrated. He didn't monitor his blood pressure at home and in clinic it was measured at 90/70.  Today, the patient is presenting with his wife  to provide history. He states that he has been fair. He has had arm pain, but has no trouble breathing.   His wife states that for having Alzheimer's this long he is doing well. He can move around very well. She has problems moving him around long distances because he gets distracted and looks at everything when moving from one place to another.  He isn't limited very much physically by his cardiac health. He had a morning unsteadiness episode which his wife believes to be caused by taking too much midodrine the night before. He has trouble responding to questions.   He had some sebaceous growths on his head which were removed 10 days ago and are healing well.  His wife states that he has been eating well.  He had a brain CT scan which found a mass the size of an olive, his wife states that at 22 they aren't planning on doing any kind of removal.  He denies any palpitations, chest pain, or shortness of breath. No headaches, syncope, orthopnea, PND, lower extremity edema or exertional symptoms.   Past Medical History:  Diagnosis Date   High cholesterol    Kidney stone    Memory loss    Sleep apnea    NPSG 08-03-07 AHI 10/hr, RDI 19.7/hr    Past Surgical History:  Procedure Laterality Date   right clavicle repair  1954   fx   TONSILLECTOMY  1942   WISDOM TOOTH EXTRACTION  1961    Current Medications: Current Meds  Medication Sig   Cholecalciferol (VITAMIN D3 PO) Take 50 mg by mouth daily.   cyanocobalamin 1000 MCG tablet Take 1,000 mcg by mouth daily.   famotidine (PEPCID) 20 MG tablet Take 1 tablet (20 mg total) by mouth daily.   finasteride (PROSCAR) 5 MG tablet Take 5 mg by mouth daily.   Melatonin 10 MG TABS Take 10  mg by mouth at bedtime.   memantine (NAMENDA) 10 MG tablet TAKE 1 TABLET BY MOUTH TWICE A DAY   potassium citrate (UROCIT-K) 10 MEQ (1080 MG) SR tablet Take 2 tablets by mouth 2 (two) times daily.    rivastigmine (EXELON) 9.5 mg/24hr Place 9.5 mg onto the skin daily.   rosuvastatin (CRESTOR) 20 MG tablet Take 1 tablet (20 mg total) by mouth daily.   sertraline (ZOLOFT) 100 MG tablet Take 100 mg by mouth daily.   tamsulosin (FLOMAX) 0.4 MG CAPS capsule Take 0.4 mg by mouth daily.   Turmeric 500 MG CAPS Take 1 capsule by mouth daily.   [DISCONTINUED] midodrine (PROAMATINE) 2.5 MG tablet Take 1 tablet (2.5  mg total) by mouth 3 (three) times daily with meals. (Patient taking differently: Take 5 mg by mouth 2 (two) times daily with a meal.)   [DISCONTINUED] midodrine (PROAMATINE) 5 MG tablet Take 1 tablet (5 mg total) by mouth 2 (two) times daily with a meal.     Allergies:   Patient has no known allergies.   Social History   Socioeconomic History   Marital status: Married    Spouse name: Pat    Number of children: 3   Years of education: 12+   Highest education level: Not on file  Occupational History   Occupation: Real Estate-rental properties  Tobacco Use   Smoking status: Never   Smokeless tobacco: Never  Vaping Use   Vaping Use: Never used  Substance and Sexual Activity   Alcohol use: Yes    Alcohol/week: 1.0 standard drink of alcohol    Types: 1 Glasses of wine per week    Comment: Socially; not weekly use   Drug use: No   Sexual activity: Not on file  Other Topics Concern   Not on file  Social History Narrative   Lives at home with wife, [name].   Caffeine use: 2 cups of tea daily   Right handed   Social Determinants of Health   Financial Resource Strain: Not on file  Food Insecurity: Not on file  Transportation Needs: Not on file  Physical Activity: Not on file  Stress: Not on file  Social Connections: Not on file     Family History: The patient's family history includes Cancer in his mother; Heart disease in his father; Pulmonary embolism in his mother; Stroke in his father.  ROS:   Please see the history of present illness.    (+) Confusion (+) Arm Pain All other systems reviewed and are negative.  EKGs/Labs/Other Studies Reviewed:    The following studies were reviewed today:  Echo 10/15/2017: Study Conclusions   - Left ventricle: The cavity size was normal. Wall thickness was    normal. Systolic function was mildly reduced. The estimated    ejection fraction was in the range of 45% to 50%. Mild diffuse    hypokinesis with no identifiable regional  variations. Doppler    parameters are consistent with abnormal left ventricular    relaxation (grade 1 diastolic dysfunction).  - Mitral valve: Systolic bowing without prolapse.   Event monitor 2019:  Normal sinus rhythm.  Mild sinus bradycardia during sleep (expected)  No pauses  No atrial fibrillation  No adverse arrhythmias   Reassuring monitor.  EKG:  EKG is personally reviewed and interpreted. 03/26/2022: the EKG was not ordered 06/26/2021: Sinus rhythm. Rate 74 bpm. RBBB. 06/12/2020: sinus rhythm 72 right bundle branch block  Recent Labs: No results found for requested labs within last  365 days.   Recent Lipid Panel No results found for: "CHOL", "TRIG", "HDL", "CHOLHDL", "VLDL", "LDLCALC", "LDLDIRECT"   Risk Assessment/Calculations:       Physical Exam:    VS:  BP 100/60 (BP Location: Left Arm, Patient Position: Sitting, Cuff Size: Normal)   Pulse 69   Ht '5\' 10"'$  (1.778 m)   Wt 176 lb (79.8 kg)   SpO2 95%   BMI 25.25 kg/m     Wt Readings from Last 3 Encounters:  03/26/22 176 lb (79.8 kg)  09/24/21 177 lb (80.3 kg)  06/26/21 174 lb (78.9 kg)     GEN:  Well nourished, well developed in no acute distress HEENT: Normal NECK: No JVD; No carotid bruits LYMPHATICS: No lymphadenopathy CARDIAC:  RRR, no murmurs, rubs, gallops RESPIRATORY:  Clear to auscultation without rales, wheezing or rhonchi  ABDOMEN: Soft, non-tender, non-distended MUSCULOSKELETAL:  No edema; No deformity  SKIN: Warm and dry NEUROLOGIC:  Alert  PSYCHIATRIC:  Normal affect   ASSESSMENT:    1. Orthostatic hypotension   2. Right bundle branch block   3. Cerebral amyloid angiopathy (HCC)      PLAN:    In order of problems listed above:  Right bundle branch block Currently stable without any syncopal episodes.  Excellent.   Cerebral amyloid angiopathy Getting treatment at [facility].  Medications reviewed.  He is seeing them once a year.   Orthostatic  hypotension Currently taking midodrine 5 mg twice a day.  Doing well with this.  He tried 3 doses but at night his blood pressures were too high.Marland Kitchen   HYPERCHOLESTEROLEMIA We will change his simvastatin 80 mg over to Crestor 20 mg.  This will help avoid drug drug interactions.  No myalgias.  Doing well.   Follow-up: 1 year    Medication Adjustments/Labs and Tests Ordered: Current medicines are reviewed at length with the patient today.  Concerns regarding medicines are outlined above.   No orders of the defined types were placed in this encounter.  Meds ordered this encounter  Medications   DISCONTD: midodrine (PROAMATINE) 5 MG tablet    Sig: Take 1 tablet (5 mg total) by mouth 2 (two) times daily with a meal.    Dispense:  180 tablet    Refill:  3   midodrine (PROAMATINE) 5 MG tablet    Sig: Take 1 tablet (5 mg total) by mouth 2 (two) times daily with a meal.    Dispense:  180 tablet    Refill:  3   Patient Instructions  Medication Instructions:  Please take Midodrine 5 mg twice a day. Continue all other medications as listed.  *If you need a refill on your cardiac medications before your next appointment, please call your pharmacy*  Follow-Up: At [facility], you and your health needs are our priority.  As part of our continuing mission to provide you with exceptional heart care, we have created designated Provider Care Teams.  These Care Teams include your primary Cardiologist (physician) and Advanced Practice Providers (APPs -  Physician Assistants and Nurse Practitioners) who all work together to provide you with the care you need, when you need it.  We recommend signing up for the patient portal called "MyChart".  Sign up information is provided on this After Visit Summary.  MyChart is used to connect with patients for Virtual Visits (Telemedicine).  Patients are able to view lab/test results, encounter notes, upcoming appointments, etc.  Non-urgent messages can be  sent to your provider as  well.   To learn more about what you can do with MyChart, go to [email or web address].    Your next appointment:   1 year(s)  The format for your next appointment:   In Person  Provider:   Candee Furbish, MD     Important Information About Sugar         I,Coren O'Brien,acting as a scribe for Candee Furbish, MD.,have documented all relevant documentation on the behalf of Candee Furbish, MD,as directed by  Candee Furbish, MD while in the presence of Candee Furbish, MD.  I, Candee Furbish, MD, have reviewed all documentation for this visit. The documentation on 03/26/22 for the exam, diagnosis, procedures, and orders are all accurate and complete.   Signed, Candee Furbish, MD  03/26/2022 4:41 PM    [place]

## 2022-03-26 NOTE — Patient Instructions (Signed)
Medication Instructions:  Please take Midodrine 5 mg twice a day. Continue all other medications as listed.  *If you need a refill on your cardiac medications before your next appointment, please call your pharmacy*  Follow-Up: At [facility], you and your health needs are our priority.  As part of our continuing mission to provide you with exceptional heart care, we have created designated Provider Care Teams.  These Care Teams include your primary Cardiologist (physician) and Advanced Practice Providers (APPs -  Physician Assistants and Nurse Practitioners) who all work together to provide you with the care you need, when you need it.  We recommend signing up for the patient portal called "MyChart".  Sign up information is provided on this After Visit Summary.  MyChart is used to connect with patients for Virtual Visits (Telemedicine).  Patients are able to view lab/test results, encounter notes, upcoming appointments, etc.  Non-urgent messages can be sent to your provider as well.   To learn more about what you can do with MyChart, go to [email or web address].    Your next appointment:   1 year(s)  The format for your next appointment:   In Person  Provider:   Candee Furbish, MD     Important Information About Sugar

## 2022-04-09 ENCOUNTER — Other Ambulatory Visit: Payer: Self-pay | Admitting: Cardiology

## 2022-05-26 DIAGNOSIS — R4182 Altered mental status, unspecified: Secondary | ICD-10-CM | POA: Diagnosis not present

## 2022-05-26 DIAGNOSIS — I6782 Cerebral ischemia: Secondary | ICD-10-CM | POA: Diagnosis not present

## 2022-05-26 DIAGNOSIS — R404 Transient alteration of awareness: Secondary | ICD-10-CM | POA: Diagnosis not present

## 2022-05-26 DIAGNOSIS — G309 Alzheimer's disease, unspecified: Secondary | ICD-10-CM | POA: Diagnosis not present

## 2022-05-26 DIAGNOSIS — R0689 Other abnormalities of breathing: Secondary | ICD-10-CM | POA: Diagnosis not present

## 2022-05-26 DIAGNOSIS — I4510 Unspecified right bundle-branch block: Secondary | ICD-10-CM | POA: Diagnosis not present

## 2022-05-26 DIAGNOSIS — R464 Slowness and poor responsiveness: Secondary | ICD-10-CM | POA: Diagnosis not present

## 2022-05-26 DIAGNOSIS — I10 Essential (primary) hypertension: Secondary | ICD-10-CM | POA: Diagnosis not present

## 2022-05-26 DIAGNOSIS — G9389 Other specified disorders of brain: Secondary | ICD-10-CM | POA: Diagnosis not present

## 2022-06-11 DIAGNOSIS — L905 Scar conditions and fibrosis of skin: Secondary | ICD-10-CM | POA: Diagnosis not present

## 2022-06-11 DIAGNOSIS — Z85828 Personal history of other malignant neoplasm of skin: Secondary | ICD-10-CM | POA: Diagnosis not present

## 2022-06-11 DIAGNOSIS — L578 Other skin changes due to chronic exposure to nonionizing radiation: Secondary | ICD-10-CM | POA: Diagnosis not present

## 2022-06-15 DIAGNOSIS — R972 Elevated prostate specific antigen [PSA]: Secondary | ICD-10-CM | POA: Diagnosis not present

## 2022-06-15 DIAGNOSIS — R3915 Urgency of urination: Secondary | ICD-10-CM | POA: Diagnosis not present

## 2022-06-15 DIAGNOSIS — N401 Enlarged prostate with lower urinary tract symptoms: Secondary | ICD-10-CM | POA: Diagnosis not present

## 2022-06-27 ENCOUNTER — Other Ambulatory Visit: Payer: Self-pay | Admitting: Cardiology

## 2022-07-16 DIAGNOSIS — H04123 Dry eye syndrome of bilateral lacrimal glands: Secondary | ICD-10-CM | POA: Diagnosis not present

## 2022-07-16 DIAGNOSIS — H0520 Unspecified exophthalmos: Secondary | ICD-10-CM | POA: Diagnosis not present

## 2022-07-16 DIAGNOSIS — H0102B Squamous blepharitis left eye, upper and lower eyelids: Secondary | ICD-10-CM | POA: Diagnosis not present

## 2022-07-16 DIAGNOSIS — H0102A Squamous blepharitis right eye, upper and lower eyelids: Secondary | ICD-10-CM | POA: Diagnosis not present

## 2022-09-07 DIAGNOSIS — Z111 Encounter for screening for respiratory tuberculosis: Secondary | ICD-10-CM | POA: Diagnosis not present

## 2022-09-09 DIAGNOSIS — E7800 Pure hypercholesterolemia, unspecified: Secondary | ICD-10-CM | POA: Diagnosis not present

## 2022-09-09 DIAGNOSIS — K219 Gastro-esophageal reflux disease without esophagitis: Secondary | ICD-10-CM | POA: Diagnosis not present

## 2022-09-09 DIAGNOSIS — Z125 Encounter for screening for malignant neoplasm of prostate: Secondary | ICD-10-CM | POA: Diagnosis not present

## 2022-09-09 DIAGNOSIS — F419 Anxiety disorder, unspecified: Secondary | ICD-10-CM | POA: Diagnosis not present

## 2022-09-11 DIAGNOSIS — R82998 Other abnormal findings in urine: Secondary | ICD-10-CM | POA: Diagnosis not present

## 2022-09-16 DIAGNOSIS — Z1331 Encounter for screening for depression: Secondary | ICD-10-CM | POA: Diagnosis not present

## 2022-09-16 DIAGNOSIS — G4733 Obstructive sleep apnea (adult) (pediatric): Secondary | ICD-10-CM | POA: Diagnosis not present

## 2022-09-16 DIAGNOSIS — N3010 Interstitial cystitis (chronic) without hematuria: Secondary | ICD-10-CM | POA: Diagnosis not present

## 2022-09-16 DIAGNOSIS — F419 Anxiety disorder, unspecified: Secondary | ICD-10-CM | POA: Diagnosis not present

## 2022-09-16 DIAGNOSIS — D692 Other nonthrombocytopenic purpura: Secondary | ICD-10-CM | POA: Diagnosis not present

## 2022-09-16 DIAGNOSIS — R972 Elevated prostate specific antigen [PSA]: Secondary | ICD-10-CM | POA: Diagnosis not present

## 2022-09-16 DIAGNOSIS — Z0000 Encounter for general adult medical examination without abnormal findings: Secondary | ICD-10-CM | POA: Diagnosis not present

## 2022-09-16 DIAGNOSIS — D696 Thrombocytopenia, unspecified: Secondary | ICD-10-CM | POA: Diagnosis not present

## 2022-09-16 DIAGNOSIS — F03A0 Unspecified dementia, mild, without behavioral disturbance, psychotic disturbance, mood disturbance, and anxiety: Secondary | ICD-10-CM | POA: Diagnosis not present

## 2022-09-16 DIAGNOSIS — E7800 Pure hypercholesterolemia, unspecified: Secondary | ICD-10-CM | POA: Diagnosis not present

## 2022-09-16 DIAGNOSIS — I951 Orthostatic hypotension: Secondary | ICD-10-CM | POA: Diagnosis not present

## 2022-09-16 DIAGNOSIS — N401 Enlarged prostate with lower urinary tract symptoms: Secondary | ICD-10-CM | POA: Diagnosis not present

## 2022-09-22 ENCOUNTER — Encounter (INDEPENDENT_AMBULATORY_CARE_PROVIDER_SITE_OTHER): Payer: PPO | Admitting: Ophthalmology

## 2022-09-22 DIAGNOSIS — H33321 Round hole, right eye: Secondary | ICD-10-CM | POA: Diagnosis not present

## 2022-09-22 DIAGNOSIS — H43813 Vitreous degeneration, bilateral: Secondary | ICD-10-CM | POA: Diagnosis not present

## 2022-11-15 ENCOUNTER — Emergency Department (HOSPITAL_COMMUNITY): Payer: PPO

## 2022-11-15 ENCOUNTER — Emergency Department (HOSPITAL_COMMUNITY)
Admission: EM | Admit: 2022-11-15 | Discharge: 2022-11-15 | Disposition: A | Discharge status: Skilled Nursing Facility | Payer: PPO | Attending: Emergency Medicine | Admitting: Emergency Medicine

## 2022-11-15 ENCOUNTER — Encounter (HOSPITAL_COMMUNITY): Payer: Self-pay | Admitting: Emergency Medicine

## 2022-11-15 DIAGNOSIS — W01198A Fall on same level from slipping, tripping and stumbling with subsequent striking against other object, initial encounter: Secondary | ICD-10-CM | POA: Insufficient documentation

## 2022-11-15 DIAGNOSIS — S0003XA Contusion of scalp, initial encounter: Secondary | ICD-10-CM | POA: Diagnosis not present

## 2022-11-15 DIAGNOSIS — R404 Transient alteration of awareness: Secondary | ICD-10-CM | POA: Diagnosis not present

## 2022-11-15 DIAGNOSIS — W19XXXA Unspecified fall, initial encounter: Secondary | ICD-10-CM | POA: Diagnosis not present

## 2022-11-15 DIAGNOSIS — G308 Other Alzheimer's disease: Secondary | ICD-10-CM | POA: Insufficient documentation

## 2022-11-15 DIAGNOSIS — Z7401 Bed confinement status: Secondary | ICD-10-CM | POA: Diagnosis not present

## 2022-11-15 DIAGNOSIS — Z043 Encounter for examination and observation following other accident: Secondary | ICD-10-CM | POA: Diagnosis not present

## 2022-11-15 DIAGNOSIS — Y92480 Sidewalk as the place of occurrence of the external cause: Secondary | ICD-10-CM | POA: Insufficient documentation

## 2022-11-15 DIAGNOSIS — F0280 Dementia in other diseases classified elsewhere without behavioral disturbance: Secondary | ICD-10-CM | POA: Diagnosis not present

## 2022-11-15 DIAGNOSIS — G309 Alzheimer's disease, unspecified: Secondary | ICD-10-CM

## 2022-11-15 DIAGNOSIS — R9431 Abnormal electrocardiogram [ECG] [EKG]: Secondary | ICD-10-CM | POA: Diagnosis not present

## 2022-11-15 DIAGNOSIS — S0990XA Unspecified injury of head, initial encounter: Secondary | ICD-10-CM | POA: Diagnosis not present

## 2022-11-15 DIAGNOSIS — R531 Weakness: Secondary | ICD-10-CM | POA: Diagnosis not present

## 2022-11-15 DIAGNOSIS — I10 Essential (primary) hypertension: Secondary | ICD-10-CM | POA: Diagnosis not present

## 2022-11-15 DIAGNOSIS — F02C0 Dementia in other diseases classified elsewhere, severe, without behavioral disturbance, psychotic disturbance, mood disturbance, and anxiety: Secondary | ICD-10-CM

## 2022-11-15 LAB — URINALYSIS, ROUTINE W REFLEX MICROSCOPIC
Bacteria, UA: NONE SEEN
Bilirubin Urine: NEGATIVE
Glucose, UA: NEGATIVE mg/dL
Ketones, ur: NEGATIVE mg/dL
Leukocytes,Ua: NEGATIVE
Nitrite: NEGATIVE
Protein, ur: 100 mg/dL — AB
RBC / HPF: >50 RBC/hpf (ref 0–5)
Specific Gravity, Urine: 1.019 (ref 1.005–1.030)
WBC, UA: >50 WBC/hpf (ref 0–5)
pH: 6 (ref 5.0–8.0)

## 2022-11-15 LAB — CBC WITH DIFFERENTIAL/PLATELET
Abs Immature Granulocytes: 0.02 10*3/uL (ref 0.00–0.07)
Basophils Absolute: 0 10*3/uL (ref 0.0–0.1)
Basophils Relative: 0 %
Eosinophils Absolute: 0.2 10*3/uL (ref 0.0–0.5)
Eosinophils Relative: 2 %
HCT: 39.7 % (ref 39.0–52.0)
Hemoglobin: 12.9 g/dL — ABNORMAL LOW (ref 13.0–17.0)
Immature Granulocytes: 0 %
Lymphocytes Relative: 19 %
Lymphs Abs: 1.5 10*3/uL (ref 0.7–4.0)
MCH: 31.2 pg (ref 26.0–34.0)
MCHC: 32.5 g/dL (ref 30.0–36.0)
MCV: 95.9 fL (ref 80.0–100.0)
Monocytes Absolute: 0.7 10*3/uL (ref 0.1–1.0)
Monocytes Relative: 9 %
Neutro Abs: 5.4 10*3/uL (ref 1.7–7.7)
Neutrophils Relative %: 70 %
Platelets: 150 10*3/uL (ref 150–400)
RBC: 4.14 MIL/uL — ABNORMAL LOW (ref 4.22–5.81)
RDW: 13.8 % (ref 11.5–15.5)
WBC: 7.8 10*3/uL (ref 4.0–10.5)
nRBC: 0 % (ref 0.0–0.2)

## 2022-11-15 LAB — BASIC METABOLIC PANEL
Anion gap: 8 (ref 5–15)
BUN: 26 mg/dL — ABNORMAL HIGH (ref 8–23)
CO2: 25 mmol/L (ref 22–32)
Calcium: 9.2 mg/dL (ref 8.9–10.3)
Chloride: 107 mmol/L (ref 98–111)
Creatinine, Ser: 1.07 mg/dL (ref 0.61–1.24)
GFR, Estimated: >60 mL/min (ref >60)
Glucose, Bld: 91 mg/dL (ref 70–99)
Potassium: 3.8 mmol/L (ref 3.5–5.1)
Sodium: 140 mmol/L (ref 135–145)

## 2022-11-15 MED ORDER — SODIUM CHLORIDE 0.9 % IV BOLUS
500.0000 mL | Freq: Once | INTRAVENOUS | Status: AC
  Administered 2022-11-15: 500 mL via INTRAVENOUS

## 2022-11-15 NOTE — ED Provider Notes (Signed)
Medical Decision Making: Care of patient assumed from Antony Madura, PA-C  at 3:36 PM.  Agree with history.  See their note for further details.  Briefly, 87 y.o. male with PMH/PSH as below.  Past Medical History:  Diagnosis Date   High cholesterol    Kidney stone    Memory loss    Sleep apnea    NPSG 08-03-07 AHI 10/hr, RDI 19.7/hr   Past Surgical History:  Procedure Laterality Date   right clavicle repair  1954   fx   TONSILLECTOMY  1942   WISDOM TOOTH EXTRACTION  1961      Patient presents with generalized weakness.  He had a fall and hit his head.  Negative trauma workup and labs overall unremarkable.  Afebrile with stable vitals.  Urinalysis currently pending.  Did have traumatic In-N-Out cath therefore will likely have hematuria and needs return precautions on urinary obstruction.  Current plan is as follows: Follow-up UTA for infection and if negative can be discharged home.  If positive, one-time dose of IV antibiotics and discharged with p.o. antibiotics.  MDM:  -Reviewed and confirmed nursing documentation for past medical history, family history, social history. -Vital signs stable. -Upon reevaluation, patient resting comfortably in bed, hemodynamically stable, no distress.  UA shows hematuria but is otherwise without infection.  Patient is stable for discharge at this time.  Due to reported traumatic cath I instructed the family to look out for any signs of urinary retention. I discussed strict return precautions for ED return.  Discussed follow-up with PCP.  Patient discharged. -Patient had no acute events while under my care in the emergency department.     The plan for this patient was discussed with Dr. Audrie Lia, who voiced agreement and who oversaw evaluation and treatment of this patient.  Marta Lamas, MD Emergency Medicine, PGY-3  Note: Dragon medical dictation software was used in the creation of this note.      Chase Caller, MD 11/15/22 1705     Glendora Score, MD 11/16/22 1433

## 2022-11-15 NOTE — ED Triage Notes (Signed)
Pt BIB [facility] from [facility].  Pt was walking with wife and collapsed on concrete.  Pt has hematoma to right eyebrow and skin tear to rigth elbow.  Facility reports he has been having weakness for the past week.  Pt does have c-collar on form EMS.   Normally non-verbal at baseline per wife.  VSS.

## 2022-11-15 NOTE — ED Notes (Signed)
Pt able to urinate using male Purewick, 150 mL. Urine appears dark red and frothy. Humes, PA-C notified and aware.

## 2022-11-15 NOTE — Progress Notes (Signed)
Orthopedic Tech Progress Note Patient Details:  [name] [birth year] [id number]  Patient ID: Dylan [name], male   DOB: [birth year], 87 y.o.   MRN: [id number] Level II; not needed. Darleen Crocker 11/15/2022, 10:37 AM

## 2022-11-15 NOTE — ED Provider Notes (Signed)
[place] EMERGENCY DEPARTMENT AT [facility] Provider Note   CSN: [id number] Arrival date & time: 11/15/22  4098     History  Chief Complaint  Patient presents with   Dylan [name] is a 87 y.o. male.  87 year old male presents to the emergency department from [facility] memory care.  He is nonverbal at baseline, per facility, but is reported to be able to follow commands.  Facility reports that he has had increasing weakness over the past week.  He was ambulating with his wife this morning when he suddenly collapsed on the concrete.  Unknown LOC.  He did strike the right side of his head on the pavement.  He has not had any subsequent vomiting.  C-collar was placed by EMS and route.  Noted to be moving all extremities during transport.  The history is provided by the EMS personnel. No language interpreter was used.  Fall       Home Medications Prior to Admission medications   Medication Sig Start Date End Date Taking? Authorizing Provider  acetaminophen (TYLENOL) 325 MG tablet Take 325 mg by mouth every 6 (six) hours as needed for mild pain.   Yes [provider]  finasteride (PROSCAR) 5 MG tablet Take 5 mg by mouth daily. 12/29/21  Yes [provider]  Melatonin 10 MG TABS Take 10 mg by mouth at bedtime.   Yes [provider]  midodrine (PROAMATINE) 5 MG tablet Take 1 tablet (5 mg total) by mouth 2 (two) times daily with a meal. Patient taking differently: Take 2.5 mg by mouth 2 (two) times daily with a meal. 03/26/22  Yes Jake Bathe, MD  rivastigmine (EXELON) 9.5 mg/24hr Place 9.5 mg onto the skin daily.   Yes [provider]  rosuvastatin (CRESTOR) 20 MG tablet TAKE 1 TABLET BY MOUTH EVERY DAY 04/09/22  Yes Jake Bathe, MD  sertraline (ZOLOFT) 100 MG tablet Take 100 mg by mouth daily. 01/28/22  Yes [provider]  tamsulosin (FLOMAX) 0.4 MG CAPS capsule Take 0.4 mg by mouth daily. 10/12/14  Yes [provider]  traZODone (DESYREL) 50 MG tablet Take 23-50 mg by mouth See admin instructions. 25 mg nightly, if not effective can increase to 50 mg nightly 07/31/22  Yes [provider]  Turmeric 500 MG CAPS Take 1 capsule by mouth daily.   Yes [provider]  famotidine (PEPCID) 20 MG tablet Take 1 tablet (20 mg total) by mouth daily. Patient not taking: Reported on 11/15/2022 06/12/20   Jake Bathe, MD  memantine (NAMENDA) 10 MG tablet TAKE 1 TABLET BY MOUTH TWICE A DAY Patient taking differently: Take 10 mg by mouth 2 (two) times daily. 05/01/19   Shawnie Dapper, NP      Allergies    Patient has no known allergies.    Review of Systems   Review of Systems  Unable to perform ROS: Patient nonverbal    Physical Exam Updated Vital Signs BP 137/84   Pulse 84   Temp 98 F (36.7 C) (Oral)   Resp 20   Ht 5\' 10"  (1.778 m)   Wt 80 kg   SpO2 95%   BMI 25.31 kg/m   Physical Exam Vitals and nursing note reviewed.  Constitutional:      General: He is not in acute distress.    Appearance: He is well-developed. He is not diaphoretic.     Comments: Chronically ill appearing, agitated. Nonverbal at  baseline.  HENT:     Head: Normocephalic.     Comments: Contusion and abrasion to right parietal scalp. No skull instability.    Right Ear: External ear normal.     Left Ear: External ear normal.     Ears:     Comments: No hemotympanum bilaterally Eyes:     General: No scleral icterus.    Extraocular Movements: Extraocular movements intact.     Conjunctiva/sclera: Conjunctivae normal.  Neck:     Comments: C-collar in place Cardiovascular:     Rate and Rhythm: Normal rate and regular rhythm.     Pulses: Normal pulses.  Pulmonary:     Effort: Pulmonary effort is normal. No respiratory distress.     Comments: Respirations even and unlabored. Lungs grossly clear. Abdominal:     General: There is no distension.     Palpations: Abdomen is soft.     Comments: Soft,  nondistended abdomen. No rigidity.  Musculoskeletal:        General: Normal range of motion.  Skin:    General: Skin is warm and dry.     Coloration: Skin is not pale.     Findings: No erythema or rash.     Comments: Mild skin breakdown to sacral region.  Neurological:     Mental Status: He is alert.     Comments: Moving all extremities spontaneously.  Psychiatric:        Behavior: Behavior normal.     ED Results / Procedures / Treatments   Labs (all labs ordered are listed, but only abnormal results are displayed) Labs Reviewed  CBC WITH DIFFERENTIAL/PLATELET - Abnormal; Notable for the following components:      Result Value   RBC 4.14 (*)    Hemoglobin 12.9 (*)    All other components within normal limits  BASIC METABOLIC PANEL - Abnormal; Notable for the following components:   BUN 26 (*)    All other components within normal limits  URINALYSIS, ROUTINE W REFLEX MICROSCOPIC    EKG EKG Interpretation  Date/Time:  Sunday November 15 2022 10:19:54 EDT Ventricular Rate:  80 PR Interval:    QRS Duration: 174 QT Interval:  436 QTC Calculation: 507 R Axis:   61 Text Interpretation: Paired ventricular premature complexes Right bundle branch block Artifact in lead(s) I II aVR aVL aVF V2 V5 V6 Confirmed by Lorre Nick ([id number]) on 11/15/2022 3:21:38 PM  Radiology CT Cervical Spine Wo Contrast  Result Date: 11/15/2022 CLINICAL DATA:  Fall from a standing position. EXAM: CT CERVICAL SPINE WITHOUT CONTRAST TECHNIQUE: Multidetector CT imaging of the cervical spine was performed without intravenous contrast. Multiplanar CT image reconstructions were also generated. RADIATION DOSE REDUCTION: This exam was performed according to the departmental dose-optimization program which includes automated exposure control, adjustment of the mA and/or kV according to patient size and/or use of iterative reconstruction technique. COMPARISON:  None Available. FINDINGS: Alignment: Slight  retrolisthesis present at C3-4. No other significant listhesis is present. Cervical lordosis is within normal limits. Skull base and vertebrae: Craniocervical junction is normal. Vertebral body heights are normal. No acute fractures are present. Soft tissues and spinal canal: No prevertebral fluid or swelling. No visible canal hematoma. Disc levels: Asymmetric left-sided facet hypertrophy is present the upper cervical spine. Moderate left foraminal narrowing is present at C3-4 and C4-5. Moderate foraminal stenosis is present bilaterally at C6-7. The foramina are otherwise patent bilaterally. Upper chest: The lung apices are clear. The thoracic inlet is within normal limits. IMPRESSION: 1.  No acute fracture or traumatic subluxation. 2. Multilevel degenerative changes of the cervical spine as described. Electronically Signed   By: Marin Roberts M.D.   On: 11/15/2022 10:56   CT HEAD WO CONTRAST ( )  Result Date: 11/15/2022 CLINICAL DATA:  87 year old male with history of trauma to the head and neck. EXAM: CT HEAD WITHOUT CONTRAST CT CERVICAL SPINE WITHOUT CONTRAST TECHNIQUE: Multidetector CT imaging of the head and cervical spine was performed following the standard protocol without intravenous contrast. Multiplanar CT image reconstructions of the cervical spine were also generated. RADIATION DOSE REDUCTION: This exam was performed according to the departmental dose-optimization program which includes automated exposure control, adjustment of the mA and/or kV according to patient size and/or use of iterative reconstruction technique. COMPARISON:  CTA of the head and neck 03/04/2015. FINDINGS: CT HEAD FINDINGS Brain: Moderate cerebral and mild cerebellar atrophy. Patchy and confluent areas of decreased attenuation are noted throughout the deep and periventricular white matter of the cerebral hemispheres bilaterally, compatible with chronic microvascular ischemic disease. No evidence of acute infarction,  hemorrhage, hydrocephalus, extra-axial collection or mass lesion/mass effect. Vascular: No hyperdense vessel or unexpected calcification. Skull: Normal. Negative for fracture or focal lesion. Sinuses/Orbits: No acute finding. Other: None. CT CERVICAL SPINE FINDINGS Alignment: Normal. Skull base and vertebrae: No acute fracture. No primary bone lesion or focal pathologic process. Soft tissues and spinal canal: No prevertebral fluid or swelling. No visible canal hematoma. Disc levels: Multilevel degenerative disc disease, most evident at C6-C7. Moderate multilevel facet arthropathy bilaterally (left-greater-than-right). Upper chest: Negative. Other: None. IMPRESSION: 1. No evidence of significant acute traumatic injury to the skull, brain or cervical spine. 2. Moderate cerebral and mild cerebellar atrophy with extensive chronic microvascular ischemic changes in the cerebral white matter, as above. 3. Multilevel degenerative disc disease and cervical spondylosis, as above. Electronically Signed   By: Trudie Reed M.D.   On: 11/15/2022 10:53   DG Chest Port 1 View  Result Date: 11/15/2022 CLINICAL DATA:  weakness EXAM: PORTABLE CHEST - 1 VIEW COMPARISON:  None Available. FINDINGS: Low lung volumes with no focal infiltrate or overt edema. Heart size and mediastinal contours are within normal limits. Aortic Atherosclerosis (ICD10-170.0). No effusion. Visualized bones unremarkable. IMPRESSION: Low lung volumes. No acute findings. Electronically Signed   By: Corlis Leak M.D.   On: 11/15/2022 10:40    Procedures Procedures    Medications Ordered in ED Medications  sodium chloride 0.9 % bolus 500 mL (0 mLs Intravenous Stopped 11/15/22 1515)    ED Course/ Medical Decision Making/ A&P Clinical Course as of 11/15/22 1543  Sun Nov 15, 2022  1330 Unable to obtain urine from patient. Condom catheter was placed as attempted foley was met with resistance with scant amount of bleeding.  [KH]  1509 Bladder scan  . Patient was sitting upright with family, resting comfortably. [KH]  1525 Patient voided ~150cc urine. Appears red in color without clots. Patient not on chronic anticoagulation. [KH]    Clinical Course User Index [KH] Antony Madura, PA-C                             Medical Decision Making Amount and/or Complexity of Data Reviewed Labs: ordered. Radiology: ordered. ECG/medicine tests: ordered.   This patient presents to the ED for concern of fall and generalized weakness, this involves an extensive number of treatment options, and is a complaint that carries with it a high risk of complications and morbidity.  The differential diagnosis includes progressive dementia vs electrolyte derangement vs dehydration vs ICH vs UTI vs arrhythmia   Co morbidities that complicate the patient evaluation  Alzheimer's dementia HLD   Additional history obtained:  Additional history obtained from EMS upon arrival External records from outside source obtained and reviewed including outpatient neurology visit in March 2024 indicating advanced dementia   Lab Tests:  I Ordered, and personally interpreted labs.  The pertinent results include:  CBC and BMP reassuring; no acute abnormalities   Imaging Studies ordered:  I ordered imaging studies including CT head/C-spine and CXR  I independently visualized and interpreted imaging which showed no acute or traumatic process on imaging I agree with the radiologist interpretation   Cardiac Monitoring:  The patient was maintained on a cardiac monitor.  I personally viewed and interpreted the cardiac monitored which showed an underlying rhythm of: NSR   Medicines ordered and prescription drug management:  I ordered medication including IVF for hydration  Reevaluation of the patient after these medicines showed that the patient stayed the same I have reviewed the patients home medicines and have made adjustments as needed   Test  Considered:  Lactic acid   Problem List / ED Course:  As above UA pending at shift change. If concerning for UTI, would initiate abx treatment; however, feel patient would still be appropriate for discharge back to his facility. No fever in the ED. No criteria for SIRS/sepsis.   Reevaluation:  After the interventions noted above, I reevaluated the patient and found that they have :stayed the same   Social Determinants of Health:  SNF resident Nonverbal   Dispostion:  Care signed out to MD Montgomery at shift change pending urinalysis. Work up in the ED, thus far, has been reassuring.         Final Clinical Impression(s) / ED Diagnoses Final diagnoses:  Fall, initial encounter  Contusion of scalp, initial encounter  Severe Alzheimer's dementia, unspecified timing of dementia onset, unspecified whether behavioral, psychotic, or mood disturbance or anxiety [facility])    Rx / DC Orders ED Discharge Orders     None         Antony Madura, PA-C 11/15/22 1605    Lorre Nick, MD 11/18/22 1129

## 2022-11-15 NOTE — Discharge Instructions (Signed)
Dylan [name]:  Thank you for allowing [place] to take care of you today.  We hope you begin feeling better soon.  To-Do: Please follow-up with your primary doctor. Please return to the Emergency Department or call 911 if you experience chest pain, shortness of breath, severe pain, severe fever, altered mental status, or have any reason to think that you need emergency medical care.  Thank you again.  Hope you feel better soon.  Department of Emergency Medicine [facility]

## 2022-11-15 NOTE — ED Notes (Signed)
Pt does have a small sore on right buttocks.   Pt cleaned and brief changed.

## 2022-11-15 NOTE — ED Notes (Signed)
Bladder scan showed 156 mL

## 2022-11-15 NOTE — ED Provider Notes (Signed)
    I provided a substantive portion of the care of this patient.  I personally made/approved the management plan for this patient and take responsibility for the patient management.  EKG Interpretation  Date/Time:  Sunday November 15 2022 10:19:54 EDT Ventricular Rate:  80 PR Interval:    QRS Duration: 174 QT Interval:  436 QTC Calculation: 507 R Axis:   61 Text Interpretation: Paired ventricular premature complexes Right bundle branch block Artifact in lead(s) I II aVR aVL aVF V2 V5 V6 Confirmed by Lorre Nick ([id number]) on 11/15/2022 3:21:38 PM   Patient is EKG shows severe artifact.  Difficult to analyze at this point.  Patient has history of dementia is here after patient was walking and fell down.  CT scan showed no acute findings right now.  Urinalysis is pending.   Lorre Nick, MD 11/15/22 [phone]

## 2022-11-15 NOTE — ED Notes (Signed)
In and out was attempted, met resistance and noticed blood on the catheter. Unable to fully insert. PA-C Humes notified and aware.

## 2022-11-19 ENCOUNTER — Telehealth: Payer: Self-pay

## 2022-11-19 NOTE — Telephone Encounter (Signed)
Transition Care Management Unsuccessful Follow-up Telephone Call  Date of discharge and from where:  [facility]  Attempts:  1st Attempt  Reason for unsuccessful TCM follow-up call:  Left voice message   Lenard Forth [facility] Guide, [facility] [phone] [address] [place], [place], [place] [address] Phone: [phone] Email: [email or web address]

## 2022-11-20 ENCOUNTER — Telehealth: Payer: Self-pay

## 2022-11-20 NOTE — Telephone Encounter (Signed)
Transition Care Management Unsuccessful Follow-up Telephone Call  Date of discharge and from where:  [facility]  Attempts:  2nd Attempt  Reason for unsuccessful TCM follow-up call:  Left voice message   Lenard Forth [facility] Guide, [facility] [phone] [address] [place], [place], [place] [address] Phone: [phone] Email: [email or web address]

## 2022-12-22 ENCOUNTER — Encounter (HOSPITAL_BASED_OUTPATIENT_CLINIC_OR_DEPARTMENT_OTHER): Payer: Self-pay | Admitting: Emergency Medicine

## 2022-12-22 ENCOUNTER — Other Ambulatory Visit: Payer: Self-pay

## 2022-12-22 ENCOUNTER — Emergency Department (HOSPITAL_BASED_OUTPATIENT_CLINIC_OR_DEPARTMENT_OTHER): Payer: PPO

## 2022-12-22 ENCOUNTER — Emergency Department (HOSPITAL_BASED_OUTPATIENT_CLINIC_OR_DEPARTMENT_OTHER)
Admission: EM | Admit: 2022-12-22 | Discharge: 2022-12-22 | Disposition: A | Discharge status: Home / Self Care | Payer: PPO | Attending: Emergency Medicine | Admitting: Emergency Medicine

## 2022-12-22 DIAGNOSIS — R6889 Other general symptoms and signs: Secondary | ICD-10-CM | POA: Diagnosis not present

## 2022-12-22 DIAGNOSIS — W07XXXA Fall from chair, initial encounter: Secondary | ICD-10-CM | POA: Diagnosis not present

## 2022-12-22 DIAGNOSIS — S065X0A Traumatic subdural hemorrhage without loss of consciousness, initial encounter: Secondary | ICD-10-CM | POA: Insufficient documentation

## 2022-12-22 DIAGNOSIS — Z23 Encounter for immunization: Secondary | ICD-10-CM | POA: Insufficient documentation

## 2022-12-22 DIAGNOSIS — M4802 Spinal stenosis, cervical region: Secondary | ICD-10-CM | POA: Diagnosis not present

## 2022-12-22 DIAGNOSIS — F0390 Unspecified dementia without behavioral disturbance: Secondary | ICD-10-CM | POA: Insufficient documentation

## 2022-12-22 DIAGNOSIS — W19XXXA Unspecified fall, initial encounter: Secondary | ICD-10-CM

## 2022-12-22 DIAGNOSIS — M47812 Spondylosis without myelopathy or radiculopathy, cervical region: Secondary | ICD-10-CM | POA: Diagnosis not present

## 2022-12-22 DIAGNOSIS — S0101XA Laceration without foreign body of scalp, initial encounter: Secondary | ICD-10-CM | POA: Diagnosis not present

## 2022-12-22 DIAGNOSIS — S065XAA Traumatic subdural hemorrhage with loss of consciousness status unknown, initial encounter: Secondary | ICD-10-CM

## 2022-12-22 DIAGNOSIS — M50323 Other cervical disc degeneration at C6-C7 level: Secondary | ICD-10-CM | POA: Diagnosis not present

## 2022-12-22 DIAGNOSIS — R609 Edema, unspecified: Secondary | ICD-10-CM | POA: Diagnosis not present

## 2022-12-22 DIAGNOSIS — S0990XA Unspecified injury of head, initial encounter: Secondary | ICD-10-CM | POA: Diagnosis not present

## 2022-12-22 MED ORDER — TETANUS-DIPHTH-ACELL PERTUSSIS 5-2.5-18.5 LF-MCG/0.5 IM SUSY
0.5000 mL | PREFILLED_SYRINGE | Freq: Once | INTRAMUSCULAR | Status: AC
  Administered 2022-12-22: 0.5 mL via INTRAMUSCULAR
  Filled 2022-12-22: qty 0.5

## 2022-12-22 MED ORDER — ACETAMINOPHEN 160 MG/5ML PO SOLN
500.0000 mg | Freq: Once | ORAL | Status: AC
  Administered 2022-12-22: 500 mg via ORAL
  Filled 2022-12-22: qty 20.3

## 2022-12-22 MED ORDER — ACETAMINOPHEN 325 MG PO TABS
650.0000 mg | ORAL_TABLET | Freq: Once | ORAL | Status: DC
  Filled 2022-12-22: qty 2

## 2022-12-22 NOTE — ED Provider Notes (Signed)
[place] EMERGENCY DEPARTMENT AT [facility] Provider Note   CSN: [id number] Arrival date & time: 12/22/22  1653     History  Chief Complaint  Patient presents with   Dylan [name] is a 87 y.o. male, dementia on hospice male who presents to the ED secondary that occurred at [facility].  He was sitting in a chair and then all of a sudden was found on the floor.  Denies any use of blood thinners, and denies any pain.  Fall occurred about an hour ago.  No loss of consciousness.  Has been acting normal.  Fall was unwitnessed.     Home Medications Prior to Admission medications   Medication Sig Start Date End Date Taking? Authorizing Provider  acetaminophen (TYLENOL) 325 MG tablet Take 325 mg by mouth every 6 (six) hours as needed for mild pain.    [provider]  famotidine (PEPCID) 20 MG tablet Take 1 tablet (20 mg total) by mouth daily. Patient not taking: Reported on 11/15/2022 06/12/20   Jake Bathe, MD  finasteride (PROSCAR) 5 MG tablet Take 5 mg by mouth daily. 12/29/21   [provider]  Melatonin 10 MG TABS Take 10 mg by mouth at bedtime.    [provider]  memantine (NAMENDA) 10 MG tablet TAKE 1 TABLET BY MOUTH TWICE A DAY Patient taking differently: Take 10 mg by mouth 2 (two) times daily. 05/01/19   Lomax, Amy, NP  midodrine (PROAMATINE) 5 MG tablet Take 1 tablet (5 mg total) by mouth 2 (two) times daily with a meal. Patient taking differently: Take 2.5 mg by mouth 2 (two) times daily with a meal. 03/26/22   Jake Bathe, MD  rivastigmine (EXELON) 9.5 mg/24hr Place 9.5 mg onto the skin daily.    [provider]  rosuvastatin (CRESTOR) 20 MG tablet TAKE 1 TABLET BY MOUTH EVERY DAY 04/09/22   Jake Bathe, MD  sertraline (ZOLOFT) 100 MG tablet Take 100 mg by mouth daily. 01/28/22   [provider]  tamsulosin (FLOMAX) 0.4 MG CAPS capsule Take 0.4 mg by mouth daily. 10/12/14   [provider]   traZODone (DESYREL) 50 MG tablet Take 23-50 mg by mouth See admin instructions. 25 mg nightly, if not effective can increase to 50 mg nightly 07/31/22   [provider]  Turmeric 500 MG CAPS Take 1 capsule by mouth daily.    [provider]      Allergies    Patient has no known allergies.    Review of Systems   Review of Systems  Skin:  Positive for wound.    Physical Exam Updated Vital Signs BP (!) 147/76   Pulse 72   Temp 98.2 F (36.8 C) (Oral)   Resp 16   Ht 5\' 10"  (1.778 m)   Wt 80 kg   SpO2 98%   BMI 25.31 kg/m  Physical Exam Vitals and nursing note reviewed.  Constitutional:      General: He is not in acute distress.    Appearance: He is well-developed.  HENT:     Head: Normocephalic and atraumatic.     Comments: Skin tear to right parietal scalp.  Negative Battle sign.  No tenderness to palpation facial bones.  No septal hematoma, or blood in the oropharynx. Eyes:     Conjunctiva/sclera: Conjunctivae normal.  Cardiovascular:     Rate and Rhythm: Normal rate and regular rhythm.     Heart sounds: No  murmur heard. Pulmonary:     Effort: Pulmonary effort is normal. No respiratory distress.     Breath sounds: Normal breath sounds.  Abdominal:     Palpations: Abdomen is soft.     Tenderness: There is no abdominal tenderness.  Musculoskeletal:        General: No swelling.     Cervical back: Neck supple.     Comments: Palpated all extremities, trunk, abdomen, and pelvis.  No tenderness to palpation.  Skin:    General: Skin is warm and dry.     Capillary Refill: Capillary refill takes less than 2 seconds.  Neurological:     Mental Status: He is alert.     Comments: At baseline per family.  No focal deficits.  Able to ambulate per baseline  Psychiatric:        Mood and Affect: Mood normal.     ED Results / Procedures / Treatments   Labs (all labs ordered are listed, but only abnormal results are displayed) Labs Reviewed - No data to  display  EKG None  Radiology CT Head Wo Contrast  Addendum Date: 12/22/2022   ADDENDUM REPORT: 12/22/2022 19:11 ADDENDUM: CT head impressions #1 and #2 called by telephone at the time of interpretation on 12/22/2022 at 6:46 pm to provider Edwin Dada , who verbally acknowledged these results. Electronically Signed   By: Jackey Loge D.O.   On: 12/22/2022 19:11   Result Date: 12/22/2022 CLINICAL DATA:  Provided history: Head trauma. Hematoma to forehead. Laceration on top of head. EXAM: CT HEAD WITHOUT CONTRAST CT CERVICAL SPINE WITHOUT CONTRAST TECHNIQUE: Multidetector CT imaging of the head and cervical spine was performed following the standard protocol without intravenous contrast. Multiplanar CT image reconstructions of the cervical spine were also generated. RADIATION DOSE REDUCTION: This exam was performed according to the departmental dose-optimization program which includes automated exposure control, adjustment of the mA and/or kV according to patient size and/or use of iterative reconstruction technique. COMPARISON:  CT head and CT cervical spine 11/15/2022. FINDINGS: CT HEAD FINDING: Brain: Mild generalized cerebral atrophy. Hypodense subdural collection overlying the right cerebral hemisphere (measuring up to 8 mm in thickness). This may reflect a subdural hygroma or chronic subdural hematoma. Mild mass effect on the underlying brain parenchyma. Subdural collection overlying the left cerebral hemisphere (measuring up to 8 mm in thickness). The collection is predominantly hypodense, but it does contain Lexey Fletes-volume acute hemorrhage. This may reflect an acute on chronic subdural hematoma or a hematohygroma. Mild mass effect upon the underlying brain parenchyma. Patchy and ill-defined hypoattenuation within the cerebral white matter, nonspecific but compatible with moderate chronic Niyonna Betsill vessel ischemic disease. Prominent perivascular space within the inferior right basal ganglia. No demarcated  cortical infarct No midline shift. Vascular: No hyperdense vessels.  Atherosclerotic calcifications. Skull: No calvarial fracture or aggressive osseous lesion. Sinuses/Orbits: No mass or acute finding within the imaged orbits. No significant paranasal sinus disease at the imaged levels. Other: Right frontoparietal scalp and right forehead hematomas. CT CERVICAL SPINE FINDINGS Alignment: Slight C3-C4 grade 1 anterolisthesis. Slight C4-C5 grade 1 anterolisthesis. Skull base and vertebrae: The basion-dental and atlanto-dental intervals are maintained.No evidence of acute fracture to the cervical spine. Soft tissues and spinal canal: No prevertebral fluid or swelling. No visible canal hematoma. Disc levels: Cervical spondylosis with multilevel disc space narrowing, disc bulges/central disc protrusions, uncovertebral hypertrophy and facet arthrosis. Disc space narrowing is greatest at C6-C7 (advanced) and there is a posterior disc osteophyte complex at this level. No appreciable  high-grade spinal canal stenosis within the cervical spine. Multilevel bony neural foraminal narrowing. Upper chest: No consolidation within the imaged lung apices. No visible pneumothorax. Attempts are being made to reach the ordering provider at this time. IMPRESSION: CT head: 1. Acute on chronic subdural hematoma or hematohygroma overlying the left cerebral hemisphere (measuring up to 8 mm in thickness). Mild mass effect on the underlying brain parenchyma. 2. Subdural hygroma or chronic subdural hematoma overlying the right cerebral hemisphere (measuring up to 8 mm in thickness). Mild mass effect upon the underlying brain parenchyma. 3. No midline shift. 4. Background parenchymal atrophy and chronic Jodiann Ognibene vessel ischemic disease, as described. 5. Right frontoparietal scalp and right forehead hematomas. CT cervical spine: 1. No evidence of an acute cervical spine fracture. 2. Mild grade 1 spondylolisthesis at C3-C4 and C4-C5. 3. Cervical  spondylosis as described. Electronically Signed: By: Jackey Loge D.O. On: 12/22/2022 18:38   CT Cervical Spine Wo Contrast  Addendum Date: 12/22/2022   ADDENDUM REPORT: 12/22/2022 19:11 ADDENDUM: CT head impressions #1 and #2 called by telephone at the time of interpretation on 12/22/2022 at 6:46 pm to provider Edwin Dada , who verbally acknowledged these results. Electronically Signed   By: Jackey Loge D.O.   On: 12/22/2022 19:11   Result Date: 12/22/2022 CLINICAL DATA:  Provided history: Head trauma. Hematoma to forehead. Laceration on top of head. EXAM: CT HEAD WITHOUT CONTRAST CT CERVICAL SPINE WITHOUT CONTRAST TECHNIQUE: Multidetector CT imaging of the head and cervical spine was performed following the standard protocol without intravenous contrast. Multiplanar CT image reconstructions of the cervical spine were also generated. RADIATION DOSE REDUCTION: This exam was performed according to the departmental dose-optimization program which includes automated exposure control, adjustment of the mA and/or kV according to patient size and/or use of iterative reconstruction technique. COMPARISON:  CT head and CT cervical spine 11/15/2022. FINDINGS: CT HEAD FINDING: Brain: Mild generalized cerebral atrophy. Hypodense subdural collection overlying the right cerebral hemisphere (measuring up to 8 mm in thickness). This may reflect a subdural hygroma or chronic subdural hematoma. Mild mass effect on the underlying brain parenchyma. Subdural collection overlying the left cerebral hemisphere (measuring up to 8 mm in thickness). The collection is predominantly hypodense, but it does contain Maurya Nethery-volume acute hemorrhage. This may reflect an acute on chronic subdural hematoma or a hematohygroma. Mild mass effect upon the underlying brain parenchyma. Patchy and ill-defined hypoattenuation within the cerebral white matter, nonspecific but compatible with moderate chronic Judyth Demarais vessel ischemic disease. Prominent  perivascular space within the inferior right basal ganglia. No demarcated cortical infarct No midline shift. Vascular: No hyperdense vessels.  Atherosclerotic calcifications. Skull: No calvarial fracture or aggressive osseous lesion. Sinuses/Orbits: No mass or acute finding within the imaged orbits. No significant paranasal sinus disease at the imaged levels. Other: Right frontoparietal scalp and right forehead hematomas. CT CERVICAL SPINE FINDINGS Alignment: Slight C3-C4 grade 1 anterolisthesis. Slight C4-C5 grade 1 anterolisthesis. Skull base and vertebrae: The basion-dental and atlanto-dental intervals are maintained.No evidence of acute fracture to the cervical spine. Soft tissues and spinal canal: No prevertebral fluid or swelling. No visible canal hematoma. Disc levels: Cervical spondylosis with multilevel disc space narrowing, disc bulges/central disc protrusions, uncovertebral hypertrophy and facet arthrosis. Disc space narrowing is greatest at C6-C7 (advanced) and there is a posterior disc osteophyte complex at this level. No appreciable high-grade spinal canal stenosis within the cervical spine. Multilevel bony neural foraminal narrowing. Upper chest: No consolidation within the imaged lung apices. No visible pneumothorax. Attempts are  being made to reach the ordering provider at this time. IMPRESSION: CT head: 1. Acute on chronic subdural hematoma or hematohygroma overlying the left cerebral hemisphere (measuring up to 8 mm in thickness). Mild mass effect on the underlying brain parenchyma. 2. Subdural hygroma or chronic subdural hematoma overlying the right cerebral hemisphere (measuring up to 8 mm in thickness). Mild mass effect upon the underlying brain parenchyma. 3. No midline shift. 4. Background parenchymal atrophy and chronic Jniya Madara vessel ischemic disease, as described. 5. Right frontoparietal scalp and right forehead hematomas. CT cervical spine: 1. No evidence of an acute cervical spine  fracture. 2. Mild grade 1 spondylolisthesis at C3-C4 and C4-C5. 3. Cervical spondylosis as described. Electronically Signed: By: Jackey Loge D.O. On: 12/22/2022 18:38    Procedures Procedures    Medications Ordered in ED Medications  Tdap (BOOSTRIX) injection 0.5 mL (0.5 mLs Intramuscular Given 12/22/22 1729)  acetaminophen (TYLENOL) 160 MG/5ML solution 500 mg (500 mg Oral Given 12/22/22 1812)    ED Course/ Medical Decision Making/ A&P                             Medical Decision Making Patient is an 87 year old male, who had an unwitnessed fall today.  He has been acting normally today, however history is limited given his dementia.  Given unwitnessed fall, I discussed further workup with the family, they declined as he is a hospice patient.  They are agreeable to head CT and neck CT.  Additionally we will update his tetanus given the wounds, and unknown last tetanus.  I use Steri-Strips to repair his skin tear to his scalp  Amount and/or Complexity of Data Reviewed Radiology: ordered.    Details: CT shows an acute on chronic subdural hematoma Discussion of management or test interpretation with external provider(s): Discussed with neurosurgery PA on-call, working with Dr. Maisie Fus, they spoke with Dr. Maisie Fus, and he does not recommend any repeat imaging.  I discussed with the family, they are hospice, and they are agreeable with this, they would prefer no intervention.  I discussed return precautions and they voiced understanding.  He is able to ambulate with assistance which is baseline for him.  He has no focal deficits.  And he has no other pain in other places.  We discussed return precautions and they voiced understanding  Risk OTC drugs. Prescription drug management.    Final Clinical Impression(s) / ED Diagnoses Final diagnoses:  Subdural hematoma (HCC)  Fall, initial encounter    Rx / DC Orders ED Discharge Orders     None         Darsha Zumstein, [place], [place] 12/22/22  2039    Edwin Dada P, DO 01/01/23 1339

## 2022-12-22 NOTE — ED Notes (Signed)
-  Called [facility] at 824pm.

## 2022-12-22 NOTE — ED Triage Notes (Signed)
Patient BIB [facility] from [facility] c/o fall from chair.  Patient has hematoma to forehead and lac to the top of his head.

## 2022-12-22 NOTE — ED Notes (Signed)
Report attempted to [facility] x2

## 2022-12-22 NOTE — ED Notes (Signed)
Family notified on ETA of transport

## 2022-12-22 NOTE — ED Notes (Signed)
Report attempted x3

## 2022-12-22 NOTE — Progress Notes (Signed)
Case d/w Dr. Maisie Fus. Pt presented to ED from hospice facility after ground level fall. H/o severe dementia. Per ED provider no discernible neurologic deficits. CTH showing mixed density b/l SDH/hygroma. No neurosurgical intervention indicated at this time, pt poor candidate. Call w/ questions/concerns.   Patrici Ranks, [facility]

## 2022-12-22 NOTE — ED Notes (Signed)
Report attempted to [employer] x1

## 2022-12-22 NOTE — Discharge Instructions (Signed)
Please follow-up with your primary care doctor, return to the ER if you feel like this condition is getting worse.  We discussed possible admission, versus discharge home, and decided on discharge home.  Return to the ER if the patient becomes more somnolent, confused, or very agitated behavior that is abnormal for him.

## 2022-12-28 ENCOUNTER — Telehealth: Payer: Self-pay

## 2022-12-28 NOTE — Telephone Encounter (Signed)
Transition Care Management Unsuccessful Follow-up Telephone Call  Date of discharge and from where:  [facility] 7/23  Attempts:  1st Attempt  Reason for unsuccessful TCM follow-up call:  No answer/busy   Lenard Forth [facility] Guide, [facility] [phone] [address] [place], [place], [place] [address] Phone: [phone] Email: [email or web address]

## 2022-12-29 ENCOUNTER — Telehealth: Payer: Self-pay

## 2022-12-29 NOTE — Telephone Encounter (Signed)
Transition Care Management Unsuccessful Follow-up Telephone Call  Date of discharge and from where:  [facility] 7/23  Attempts:  2nd Attempt  Reason for unsuccessful TCM follow-up call:  No answer/busy   Lenard Forth [facility] Guide, [facility] [phone] [address] [place], [place], [place] [address] Phone: [phone] Email: [email or web address]

## 2023-03-01 DIAGNOSIS — G4089 Other seizures: Secondary | ICD-10-CM | POA: Diagnosis not present

## 2023-03-01 DIAGNOSIS — F339 Major depressive disorder, recurrent, unspecified: Secondary | ICD-10-CM | POA: Diagnosis not present

## 2023-03-08 DIAGNOSIS — H00014 Hordeolum externum left upper eyelid: Secondary | ICD-10-CM | POA: Diagnosis not present

## 2023-03-08 DIAGNOSIS — G301 Alzheimer's disease with late onset: Secondary | ICD-10-CM | POA: Diagnosis not present

## 2023-03-08 DIAGNOSIS — F0280 Dementia in other diseases classified elsewhere without behavioral disturbance: Secondary | ICD-10-CM | POA: Diagnosis not present

## 2023-03-08 DIAGNOSIS — H109 Unspecified conjunctivitis: Secondary | ICD-10-CM | POA: Diagnosis not present

## 2023-04-02 DEATH — deceased

## 2023-10-23 IMAGING — MR MR PROSTATE WO/W CM
12 series · 48 of 48 positions shown · IV contrast (agent unspecified)
Comparison: None Available.

CLINICAL DATA: Elevated PSA level of 13.77 on 08/29/2021.

EXAM:
MR PROSTATE WITHOUT AND WITH CONTRAST
TECHNIQUE: Multiplanar multisequence MRI images were obtained of the pelvis
centered about the prostate. Pre and post contrast images were
obtained.
CONTRAST:  8 cc [employer]

[Series 3: T2 · coronal · 3.0mm · 0.56mm/px · 1 of 23 slices shown (1 of 3)]
[im 1/23]
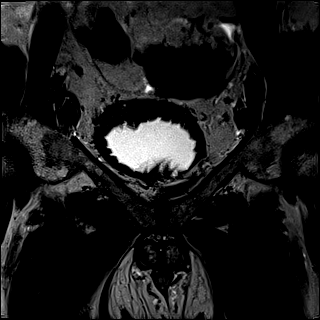

[Series 4: T1 · axial · 5.0mm · 1.25mm/px · 1 of 80 slices shown]
[im 1/80]
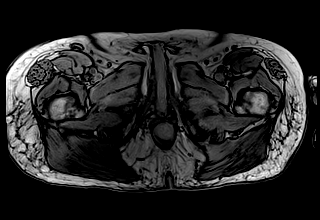

[Series 5: DWI · axial · 3.0mm · 1.75mm/px · z∈[+23,+86]mm · 2 of 66 slices shown (1 of 3)]
[im 1/66]
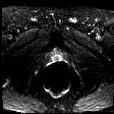
[im 66/66]
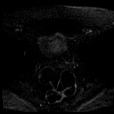

[Series 6: DWI · axial · 3.0mm · 1.75mm/px · 1 of 22 slices shown (2 of 3)]
[im 1/22]
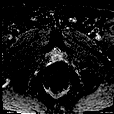

[Series 7: DWI · axial · 3.0mm · 1.75mm/px · 1 of 22 slices shown (3 of 3)]
[im 1/22]
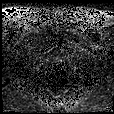

[Series 8: T2 · axial · 3.0mm · 0.56mm/px · 1 of 23 slices shown (2 of 3)]
[im 1/23]
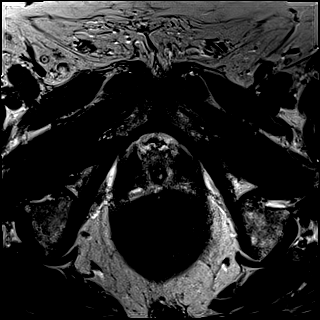

[Series 9: T2 · axial · 1.0mm · 1.04mm/px · z∈[+19,+90]mm · 2 of 72 slices shown (3 of 3)]
[im 1/72]
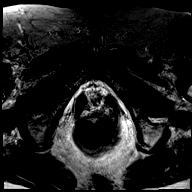
[im 72/72]
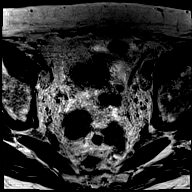

[Series 10: pre t1_twist_tra_dyn · axial · non-contrast · 3.5mm · 0.83mm/px · 1 of 20 slices shown]
[im 1/20]
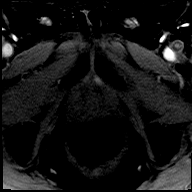

[Series 11: post t1_twist_tra_dyn-copy center · axial · non-contrast · 3.5mm · 0.83mm/px · z∈[+21,+87]mm · 17 of 600 slices shown]
[im 1/600]
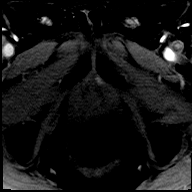
[im 38/600]
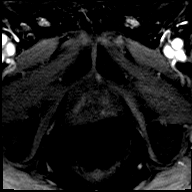
[im 75/600]
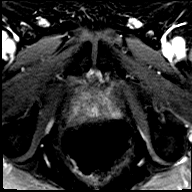
[im 113/600]
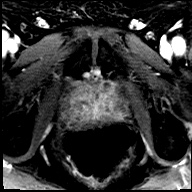
[im 150/600]
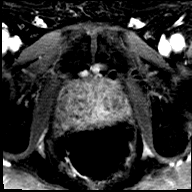
[im 188/600]
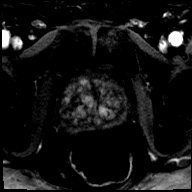
[im 225/600]
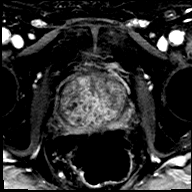
[im 263/600]
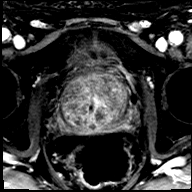
[im 300/600]
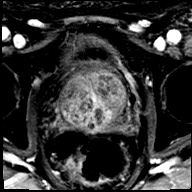
[im 337/600]
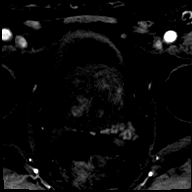
[im 375/600]
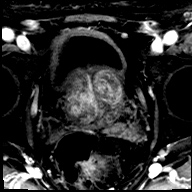
[im 412/600]
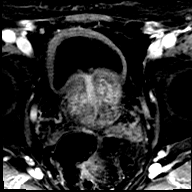
[im 450/600]
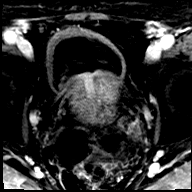
[im 487/600]
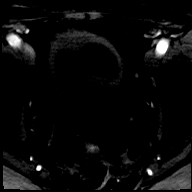
[im 525/600]
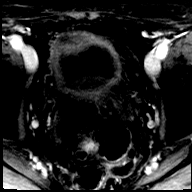
[im 562/600]
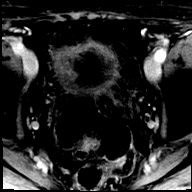
[im 600/600]
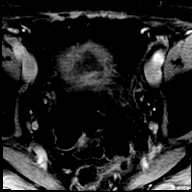

[Series 12: post t1_twist_tra_dyn-copy cent_sub · axial · 3.5mm · 0.83mm/px · z∈[+21,+87]mm · 17 of 580 slices shown]
[im 1/580]
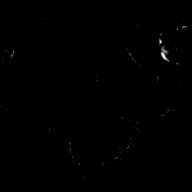
[im 37/580]
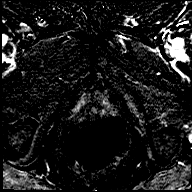
[im 73/580]
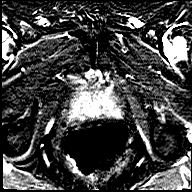
[im 109/580]
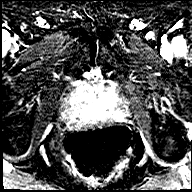
[im 145/580]
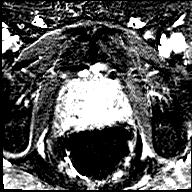
[im 181/580]
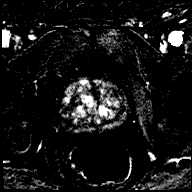
[im 218/580]
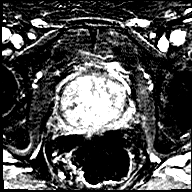
[im 254/580]
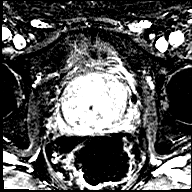
[im 290/580]
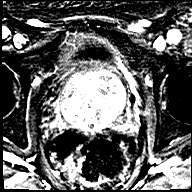
[im 326/580]
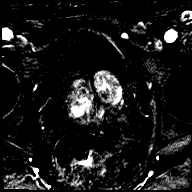
[im 362/580]
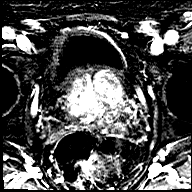
[im 399/580]
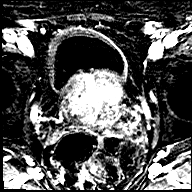
[im 435/580]
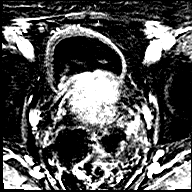
[im 471/580]
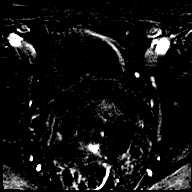
[im 507/580]
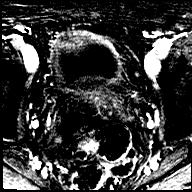
[im 543/580]
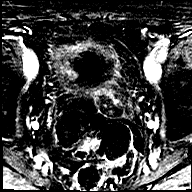
[im 580/580]
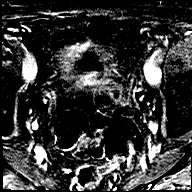

[Series 13: t1_vibe_dixon_tra_f · axial · 2.5mm · 0.91mm/px · z∈[+1,+198]mm · 2 of 80 slices shown]
[im 1/80]
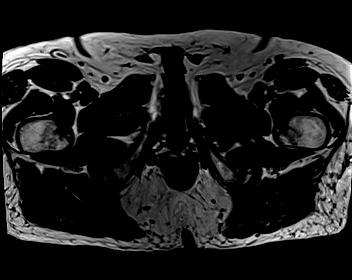
[im 80/80]
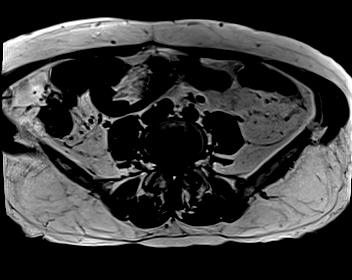

[Series 14: t1_vibe_dixon_tra_w · axial · 2.5mm · 0.91mm/px · z∈[+1,+198]mm · 2 of 80 slices shown]
[im 1/80]
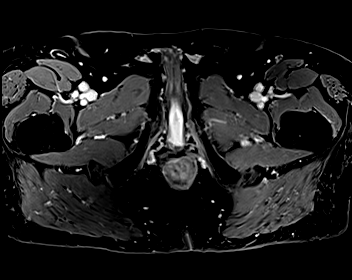
[im 80/80]
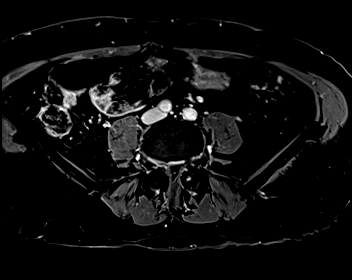

[48 of 48 positions shown; findings below may reference images not displayed]

FINDINGS: Prostate: Encapsulated nodularity in the transition zone compatible
with benign prostatic hypertrophy. Hazy non focal low T2 signal is
present in the peripheral zone bilaterally, likely postinflammatory
and considered PI-RADS category 2.

Region of interest # 1: PI-RADS category 4 lesion of the left
posterolateral peripheral zone the apex with mild focally reduced T2
signal and accentuated ADC map activity (image 18 of series 6 and
7), focally reduced T2 signal (image 20, series 8) and focal early
enhancement (image 137, series 12). This lesion measures 0.21 cc
(0.8 by 0.5 by 0.6 cm).

Volume: 3D volumetric analysis: Prostate volume 95.01 cc (6.1 by
by 6.0 cm).

Transcapsular spread:  Absent

Seminal vesicle involvement: Absent

Neurovascular bundle involvement: Absent

Pelvic adenopathy: Absent

Bone metastasis: Absent

Other findings: Sigmoid colon diverticulosis. 2.3 by 1.6 by 1.9 cm
likely enhancing lesion within or along the anterior wall of the
sigmoid colon on image 36 series 14.
IMPRESSION: 1. PI-RADS category 4 lesion of the left posterolateral peripheral
zone at the apex. Targeting data sent to [facility].
2. 2.3 cm in long axis enhancing lesion within or along the anterior
wall of the sigmoid colon, suspicious for sigmoid colon cancer.
Given the underlying sigmoid colon diverticulosis, localized acute
diverticulitis is a differential diagnostic consideration, correlate
with patient's symptoms. Gastroenterologist referral recommended.
3. Prostatomegaly and benign prostatic hypertrophy.
